# Patient Record
Sex: Female | Born: 1980 | Race: White | Hispanic: No | Marital: Married | Smoking: Never smoker
Health system: Southern US, Community
[De-identification: ages and names within clinical notes are randomized; demographics above are authoritative.]

## PROBLEM LIST (undated history)

## (undated) DIAGNOSIS — E559 Vitamin D deficiency, unspecified: Secondary | ICD-10-CM

## (undated) DIAGNOSIS — R7303 Prediabetes: Secondary | ICD-10-CM

## (undated) HISTORY — DX: Vitamin D deficiency, unspecified: E55.9

## (undated) HISTORY — DX: Prediabetes: R73.03

---

## 2001-12-18 ENCOUNTER — Other Ambulatory Visit: Admission: RE | Admit: 2001-12-18 | Discharge: 2001-12-18 | Payer: Self-pay | Admitting: Obstetrics and Gynecology

## 2003-02-07 ENCOUNTER — Other Ambulatory Visit: Admission: RE | Admit: 2003-02-07 | Discharge: 2003-02-07 | Payer: Self-pay | Admitting: Obstetrics and Gynecology

## 2004-02-13 ENCOUNTER — Other Ambulatory Visit: Admission: RE | Admit: 2004-02-13 | Discharge: 2004-02-13 | Payer: Self-pay | Admitting: Obstetrics and Gynecology

## 2005-03-25 ENCOUNTER — Other Ambulatory Visit: Admission: RE | Admit: 2005-03-25 | Discharge: 2005-03-25 | Payer: Self-pay | Admitting: Obstetrics and Gynecology

## 2011-09-23 ENCOUNTER — Ambulatory Visit: Payer: BC Managed Care – PPO

## 2011-09-23 ENCOUNTER — Ambulatory Visit (INDEPENDENT_AMBULATORY_CARE_PROVIDER_SITE_OTHER): Payer: BC Managed Care – PPO | Admitting: Internal Medicine

## 2011-09-23 VITALS — BP 120/84 | HR 80 | Temp 98.8°F | Resp 16 | Ht 68.5 in | Wt 109.0 lb

## 2011-09-23 DIAGNOSIS — R1310 Dysphagia, unspecified: Secondary | ICD-10-CM

## 2011-09-23 LAB — POCT CBC
Granulocyte percent: 45.2 %G (ref 37–80)
HCT, POC: 43.7 % (ref 37.7–47.9)
Hemoglobin: 14.1 g/dL (ref 12.2–16.2)
Lymph, poc: 2.1 (ref 0.6–3.4)
MCH, POC: 30.3 pg (ref 27–31.2)
MCHC: 32.3 g/dL (ref 31.8–35.4)
MCV: 93.7 fL (ref 80–97)
MID (cbc): 0.3 (ref 0–0.9)
MPV: 9.9 fL (ref 0–99.8)
POC Granulocyte: 1.9 — AB (ref 2–6.9)
POC LYMPH PERCENT: 48.2 %L (ref 10–50)
POC MID %: 6.6 %M (ref 0–12)
Platelet Count, POC: 200 10*3/uL (ref 142–424)
RBC: 4.66 M/uL (ref 4.04–5.48)
RDW, POC: 12.9 %
WBC: 4.3 10*3/uL — AB (ref 4.6–10.2)

## 2011-09-23 LAB — TSH: TSH: 1.17 u[IU]/mL (ref 0.350–4.500)

## 2011-09-23 NOTE — Patient Instructions (Signed)
Dysphagia  Swallowing problems (dysphagia) occur when solids and liquids seem to stick in your throat on the way down to your stomach, or the food takes longer to get to the stomach. Other symptoms (problems) include regurgitating (burping) up food, noises coming from the throat, chest discomfort with swallowing, and a feeling of fullness in the throat when swallowing. When blockage in the throat is complete it may be associated with drooling.  CAUSES  There are many causes of swallowing difficulties and the following is generalized information regarding a number of reasons for this problem. Problems with swallowing may occur because of problems with the muscles. The food cannot be propelled in the usual manner into the stomach. There may be ulcers, scar tissueor inflammation (soreness) in the esophagus (the food tube from the mouth to the stomach) which blocks food from passing normally into the stomach. Causes of inflammation include acid reflux from the stomach into the esophagus. Inflammation can also be caused by the herpes simplex virus, Candida (yeast), radiation (as with treatment of cancer), or inflammation from medications not taken with adequate fluids to wash them down into the stomach. There may be nerve problems so signals cannot be sent adequately telling the muscles of the esophagus to contract and move the food along. Achalasia is a rare disorder of the esophagus in which muscular contractions of the esophagus are uncoordinated. Globus hystericus is a relatively common problem in young females in which there is a sense of an obstruction or difficulty in swallowing, but in which no abnormalities can be found. This problem usually improves over time with reassurance and testing to rule out other causes.  DIAGNOSIS  A number of tests will help your caregiver know what is the cause of your swallowing problems. These tests may include a barium swallow in which x-rays are taken while you are drinking a  liquid that outlines the lining of the esophagus on x-ray. If the stomach and small bowel are also studied in this manner it is called an upper gastrointestinal exam (UGI). Endoscopy may be done in which your caregiver examines your throat, esophagus, stomach and small bowel with an instrument like a small flexible telescope. Motility studies which measure the effectiveness and coordination of the muscular contractions of the esophagus may also be done.  TREATMENT  The treatment of swallowing problems are many, varying from medications to surgical treatment. The treatment varies with the type of problem found. Your caregiver will discuss your results and treatment with you. If swallowing problems are severe the long term problems which may occur include: malnutrition, pneumonia (from food going into the breathing tubes called trachea and bronchi), and an increase in tumors (lumps) of the esophagus.  SEEK IMMEDIATE MEDICAL CARE IF:   Food or other object becomes lodged in your throat or esophagus and won't move.  Document Released: 03/25/2000 Document Revised: 03/17/2011 Document Reviewed: 11/14/2007  [employer] Patient Information 2012 [employer]

## 2011-09-23 NOTE — Progress Notes (Signed)
  Subjective:    Patient ID: Mercedes Lewis, female    DOB: [birth year], 31 y.o.   MRN: [id number]  HPI Patient presents with foreign body sensation in her esophagus. States the symptoms started yesterday, probably after eating breakfast. She denies and sore throat, pain, fever, chills, nausea, vomiting, trouble breathing, or lip/tongue swelling. Denies any history of thyroid problems or known medical problems. States she is able to swallow but it "feels like there is something in her throat." Says it is not painful but is intermittent in nature in that sometimes it feels "tighter" than others.      Review of Systems  All other systems reviewed and are negative.       Objective:   Physical Exam  Constitutional: She is oriented to person, place, and time. She appears well-developed and well-nourished.  HENT:  Head: Normocephalic and atraumatic.  Right Ear: External ear normal.  Left Ear: External ear normal.  Mouth/Throat: Oropharynx is clear and moist. No oropharyngeal exudate.  Eyes: Conjunctivae are normal.  Neck: Normal range of motion. Neck supple. No tracheal deviation present. No thyromegaly present.  Cardiovascular: Normal rate, regular rhythm and normal heart sounds.   Pulmonary/Chest: Effort normal and breath sounds normal.  Lymphadenopathy:    She has no cervical adenopathy.  Neurological: She is alert and oriented to person, place, and time.  Skin: Skin is warm and dry.  Psychiatric: She has a normal mood and affect. Her behavior is normal. Judgment and thought content normal.    [facility] reading (PRIMARY) by  Dr. Perrin Maltese as normal soft tissue neck.        Assessment & Plan:   1. Dysphagia  Likely an acute esophageal abrasion Treat symptoms with ibuprofen or tylenol Consider Maalox to help reduce acid  If no improvement in 24-48 hours, refer to ENT for further evaluation and treatment.  POCT CBC, TSH, DG Neck Soft Tissue

## 2012-05-28 ENCOUNTER — Ambulatory Visit (HOSPITAL_COMMUNITY)
Admission: RE | Admit: 2012-05-28 | Discharge: 2012-05-28 | Disposition: A | Discharge status: Home / Self Care | Payer: BC Managed Care – PPO | Source: Ambulatory Visit | Attending: Internal Medicine | Admitting: Internal Medicine

## 2012-05-28 ENCOUNTER — Other Ambulatory Visit (HOSPITAL_COMMUNITY): Payer: Self-pay | Admitting: Internal Medicine

## 2012-05-28 DIAGNOSIS — R229 Localized swelling, mass and lump, unspecified: Secondary | ICD-10-CM | POA: Insufficient documentation

## 2012-06-07 ENCOUNTER — Other Ambulatory Visit: Payer: Self-pay | Admitting: Otolaryngology

## 2012-06-13 ENCOUNTER — Ambulatory Visit
Admission: RE | Admit: 2012-06-13 | Discharge: 2012-06-13 | Disposition: A | Discharge status: Home / Self Care | Payer: BC Managed Care – PPO | Source: Ambulatory Visit | Attending: Otolaryngology | Admitting: Otolaryngology

## 2013-06-24 ENCOUNTER — Other Ambulatory Visit: Payer: Self-pay | Admitting: *Deleted

## 2013-06-24 ENCOUNTER — Other Ambulatory Visit: Payer: Self-pay | Admitting: Internal Medicine

## 2013-06-24 MED ORDER — NORETHIN ACE-ETH ESTRAD-FE 1-20 MG-MCG PO TABS: ORAL_TABLET | ORAL | Status: DC

## 2013-08-02 IMAGING — CT CT HEAD W/O CM
3 of 6 series · 16 of 47 positions shown, 19 images · non-contrast
Comparison: Plain film examination 05/28/2012.  No comparison CT or
MR.

CLINICAL DATA: Evaluate bony protrude there is left forehead.
Present for 10 years.  No trauma.  No pain.  The the patient feels
and may be growing.

CT HEAD WITHOUT CONTRAST
TECHNIQUE: Contiguous axial images were obtained from the base of
the skull through the vertex without contrast.

[Series 103: coronal · coronal · 0.44mm/px · 3 of 103 slices shown]
[im 21/103  brain]
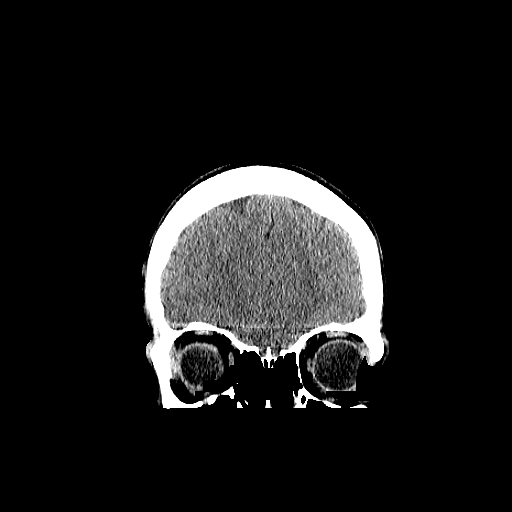
[im 41/103  brain]
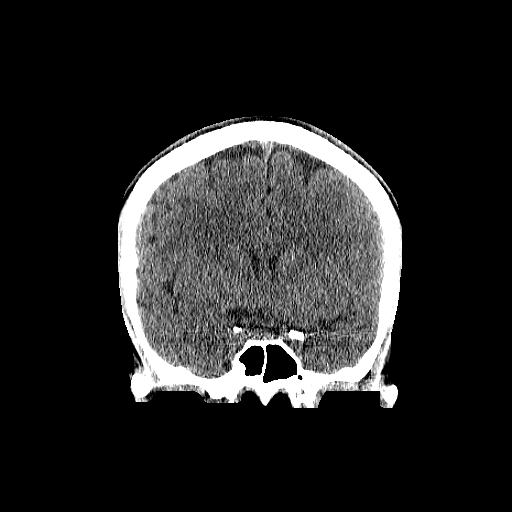
[im 62/103  brain]
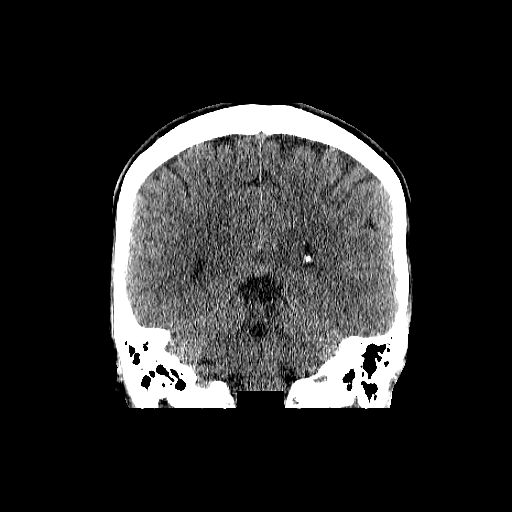

[Series 104: sagittal · sagittal · 0.44mm/px · 3 of 85 slices shown]
[im 29/85  brain]
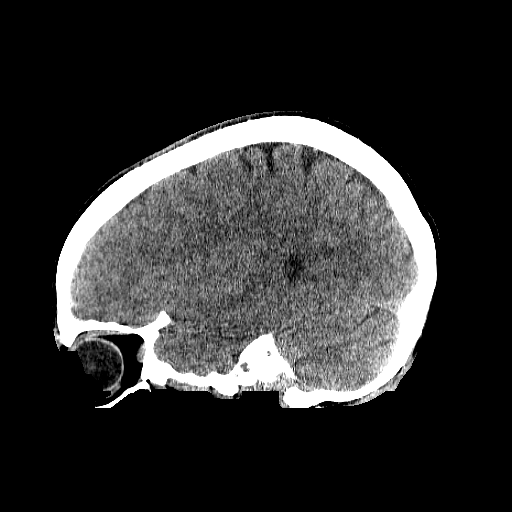
[im 43/85  brain]
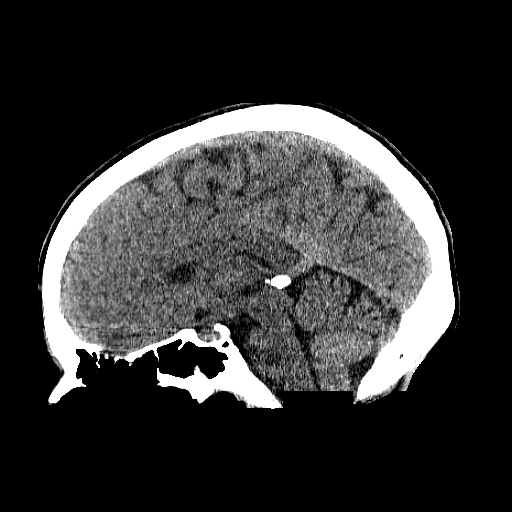
[im 57/85  brain]
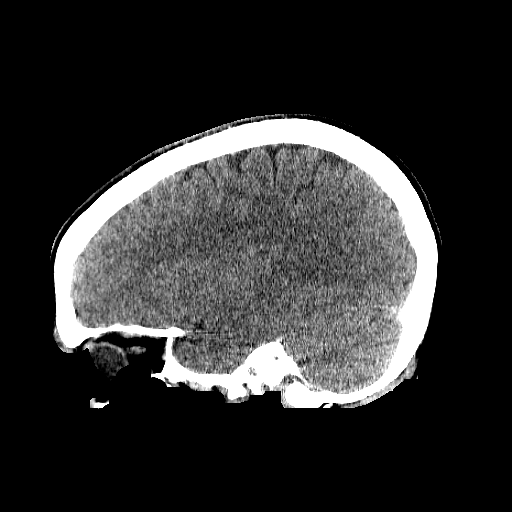

[Series 602: sagittal body · sagittal · 0.49mm/px · 10 of 62 slices shown, 13 images]
[im 5/62  brain]
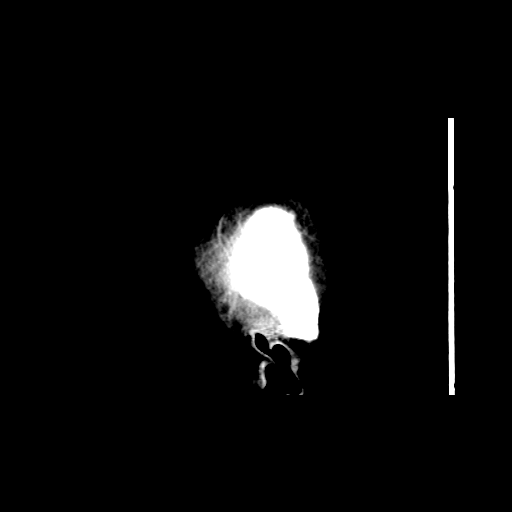
[im 5/62  bone]
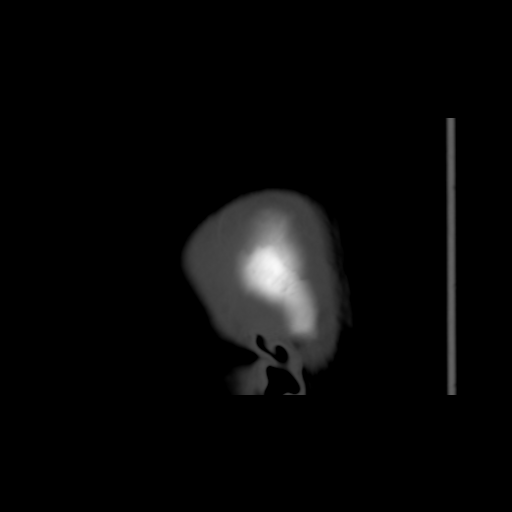
[im 13/62  brain]
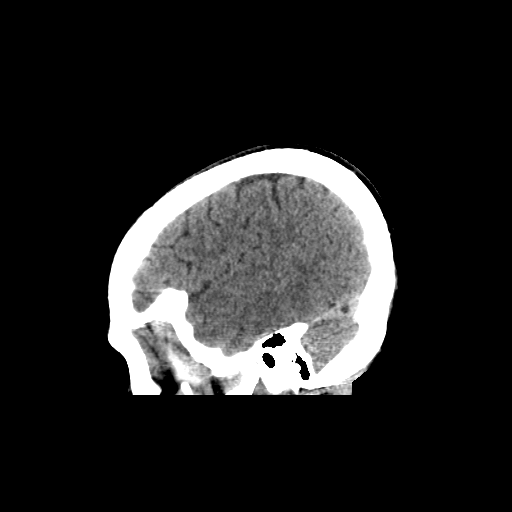
[im 17/62  brain]
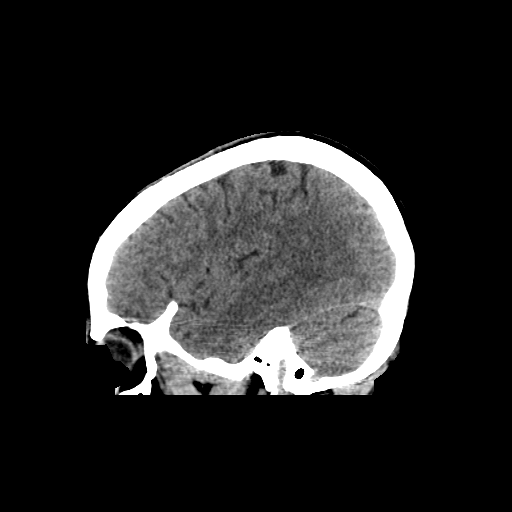
[im 21/62  brain]
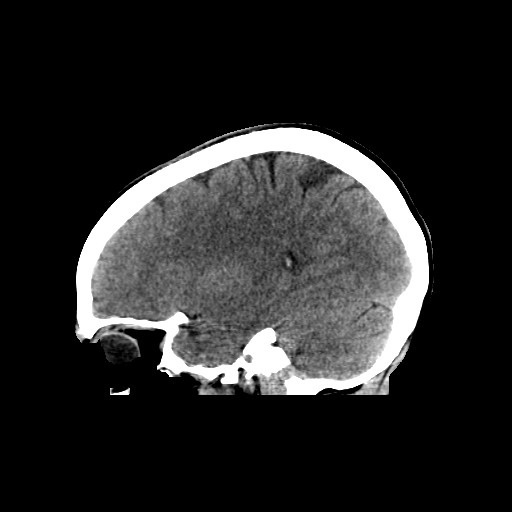
[im 29/62  brain]
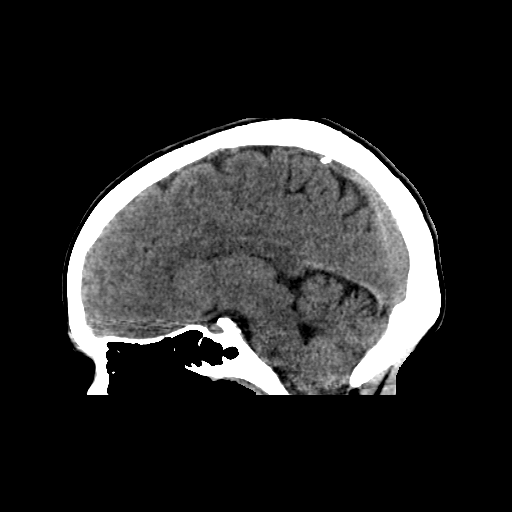
[im 29/62  bone]
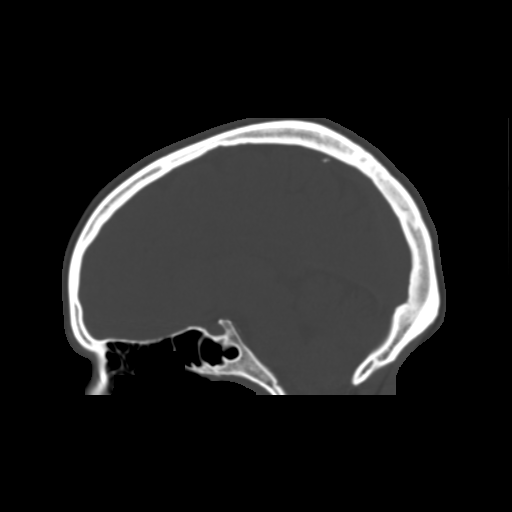
[im 33/62  brain]
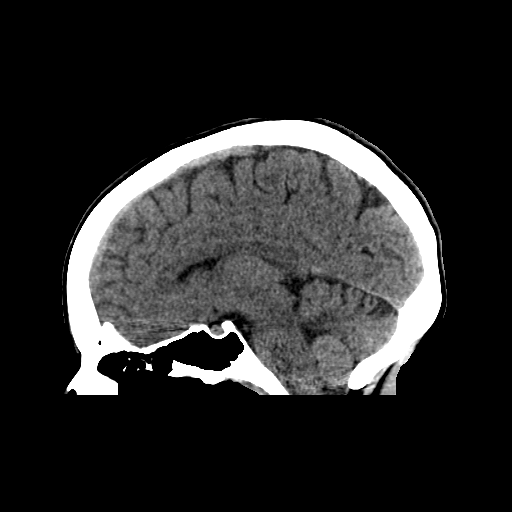
[im 41/62  brain]
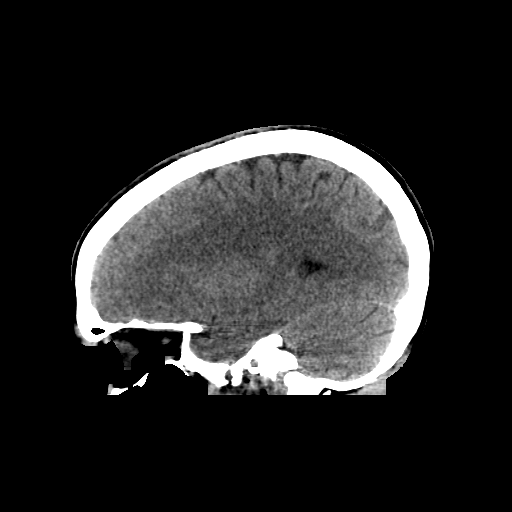
[im 45/62  brain]
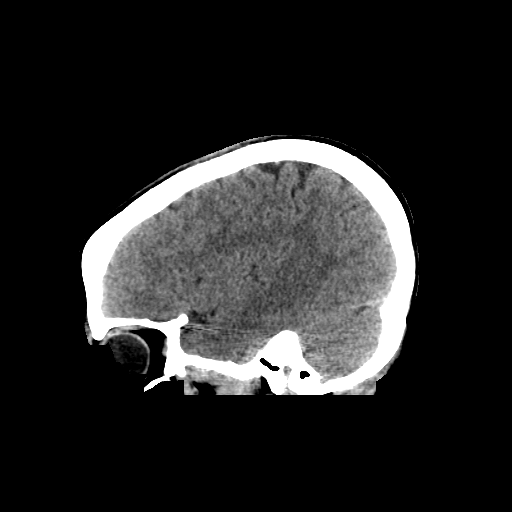
[im 49/62  brain]
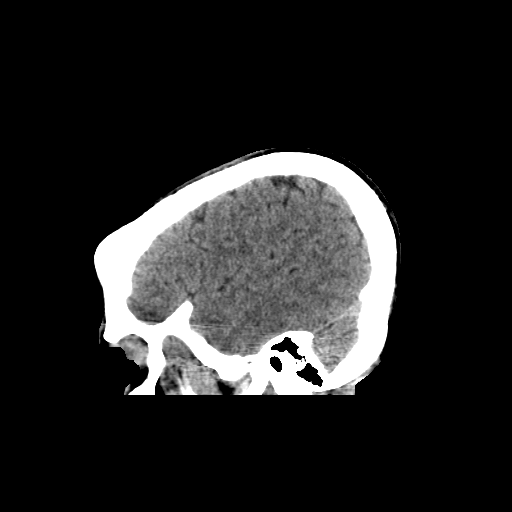
[im 49/62  bone]
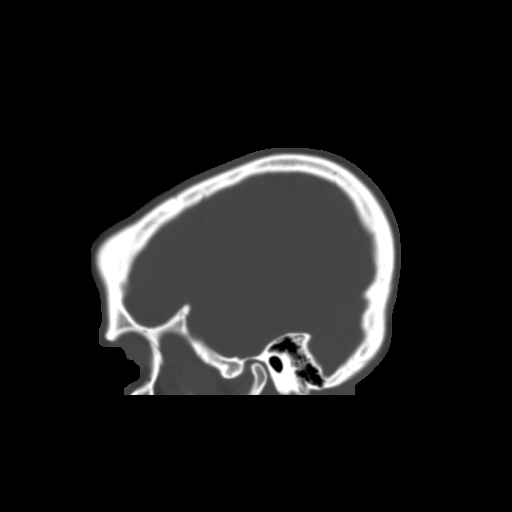
[im 57/62  brain]
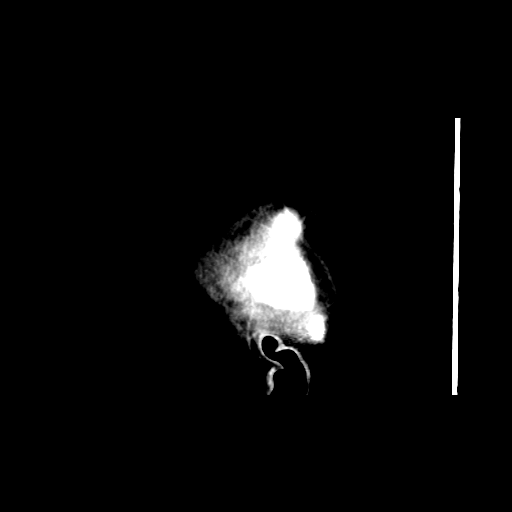

[16 of 47 positions shown; findings below may reference images not displayed]

FINDINGS: Anterior left frontal calvarium where the patient has a
palpable abnormality, there is bony overgrowth with an elliptical
shape which has an appearance most suggestive of an osteoma
(spanning over 2.9 cm).

Minimal partial opacification right mastoid air cells.

No intracranial hemorrhage.

No hydrocephalus.

No CT evidence of large acute infarct.

Slightly prominent pineal calcifications probably an incidental
finding.

Pituitary region, cervical medullary junction and orbital
structures unremarkable.
IMPRESSION: Anterior left frontal calvarium where the patient has a palpable
abnormality, there is bony overgrowth with an elliptical shape
which has an appearance most suggestive of an osteoma (spanning
over 2.9 cm).

Minimal partial opacification right mastoid air cells

## 2013-08-19 ENCOUNTER — Other Ambulatory Visit: Payer: Self-pay | Admitting: Emergency Medicine

## 2014-01-26 DIAGNOSIS — R7303 Prediabetes: Secondary | ICD-10-CM | POA: Insufficient documentation

## 2014-01-26 DIAGNOSIS — E559 Vitamin D deficiency, unspecified: Secondary | ICD-10-CM | POA: Insufficient documentation

## 2014-01-27 ENCOUNTER — Ambulatory Visit (INDEPENDENT_AMBULATORY_CARE_PROVIDER_SITE_OTHER): Payer: BC Managed Care – PPO | Admitting: Internal Medicine

## 2014-01-27 ENCOUNTER — Encounter: Payer: Self-pay | Admitting: Internal Medicine

## 2014-01-27 VITALS — BP 118/72 | HR 76 | Temp 98.1°F | Resp 16 | Ht 69.0 in | Wt 113.4 lb

## 2014-01-27 DIAGNOSIS — R7303 Prediabetes: Secondary | ICD-10-CM

## 2014-01-27 DIAGNOSIS — Z79899 Other long term (current) drug therapy: Secondary | ICD-10-CM

## 2014-01-27 DIAGNOSIS — Z1212 Encounter for screening for malignant neoplasm of rectum: Secondary | ICD-10-CM

## 2014-01-27 DIAGNOSIS — Z23 Encounter for immunization: Secondary | ICD-10-CM

## 2014-01-27 DIAGNOSIS — Z111 Encounter for screening for respiratory tuberculosis: Secondary | ICD-10-CM

## 2014-01-27 DIAGNOSIS — R6889 Other general symptoms and signs: Secondary | ICD-10-CM

## 2014-01-27 DIAGNOSIS — E559 Vitamin D deficiency, unspecified: Secondary | ICD-10-CM

## 2014-01-27 DIAGNOSIS — R7309 Other abnormal glucose: Secondary | ICD-10-CM

## 2014-01-27 DIAGNOSIS — Z0001 Encounter for general adult medical examination with abnormal findings: Secondary | ICD-10-CM

## 2014-01-27 LAB — CBC WITH DIFFERENTIAL/PLATELET
BASOS ABS: 0 10*3/uL (ref 0.0–0.1)
BASOS PCT: 0 % (ref 0–1)
EOS ABS: 0.1 10*3/uL (ref 0.0–0.7)
Eosinophils Relative: 2 % (ref 0–5)
HCT: 40.6 % (ref 36.0–46.0)
HEMOGLOBIN: 13.6 g/dL (ref 12.0–15.0)
Lymphocytes Relative: 58 % — ABNORMAL HIGH (ref 12–46)
Lymphs Abs: 3.1 10*3/uL (ref 0.7–4.0)
MCH: 30.6 pg (ref 26.0–34.0)
MCHC: 33.5 g/dL (ref 30.0–36.0)
MCV: 91.4 fL (ref 78.0–100.0)
Monocytes Absolute: 0.4 10*3/uL (ref 0.1–1.0)
Monocytes Relative: 8 % (ref 3–12)
NEUTROS PCT: 32 % — AB (ref 43–77)
Neutro Abs: 1.7 10*3/uL (ref 1.7–7.7)
Platelets: 168 10*3/uL (ref 150–400)
RBC: 4.44 MIL/uL (ref 3.87–5.11)
RDW: 12.8 % (ref 11.5–15.5)
WBC: 5.4 10*3/uL (ref 4.0–10.5)

## 2014-01-27 LAB — HEMOGLOBIN A1C
Hgb A1c MFr Bld: 5.5 % (ref <5.7)
Mean Plasma Glucose: 111 mg/dL (ref <117)

## 2014-01-27 NOTE — Patient Instructions (Signed)
Recommend the book "The END of DIETING" by Dr Baker Janus   and the book "The END of DIABETES " by Dr Excell Seltzer  At [email or web address] - get book & Audio CD's      Being diabetic has a  300% increased risk for heart attack, stroke, cancer, and alzheimer- type vascular dementia. It is very important that you work harder with diet by avoiding all foods that are white except chicken & fish. Avoid white rice (brown & wild rice is OK), white potatoes (sweetpotatoes in moderation is OK), White bread or wheat bread or anything made out of white flour like bagels, donuts, rolls, buns, biscuits, cakes, pastries, cookies, pizza crust, and pasta (made from white flour & egg whites) - vegetarian pasta or spinach or wheat pasta is OK. Multigrain breads like Arnold's or [employer], or multigrain sandwich thins or flatbreads.  Diet, exercise and weight loss can reverse and cure diabetes in the early stages.  Diet, exercise and weight loss is very important in the control and prevention of complications of diabetes which affects every system in your body, ie. Brain - dementia/stroke, eyes - glaucoma/blindness, heart - heart attack/heart failure, kidneys - dialysis, stomach - gastric paralysis, intestines - malabsorption, nerves - severe painful neuritis, circulation - gangrene & loss of a leg(s), and finally cancer and Alzheimers.    I recommend avoid fried & greasy foods,  sweets/candy, white rice (brown or wild rice or Quinoa is OK), white potatoes (sweet potatoes are OK) - anything made from white flour - bagels, doughnuts, rolls, buns, biscuits,white and wheat breads, pizza crust and traditional pasta made of white flour & egg white(vegetarian pasta or spinach or wheat pasta is OK).  Multi-grain bread is OK - like multi-grain flat bread or sandwich thins. Avoid alcohol in excess. Exercise is also important.    Eat all the vegetables you want - avoid meat, especially red meat and dairy - especially cheese.  Cheese  is the most concentrated form of trans-fats which is the worst thing to clog up our arteries. Veggie cheese is OK which can be found in the fresh produce section at [employer] or [employer] or [employer]  Preventive Care for Adults A healthy lifestyle and preventive care can promote health and wellness. Preventive health guidelines for women include the following key practices.  A routine yearly physical is a good way to check with your health care provider about your health and preventive screening. It is a chance to share any concerns and updates on your health and to receive a thorough exam.  Visit your dentist for a routine exam and preventive care every 6 months. Brush your teeth twice a day and floss once a day. Good oral hygiene prevents tooth decay and gum disease.  The frequency of eye exams is based on your age, health, family medical history, use of contact lenses, and other factors. Follow your health care provider's recommendations for frequency of eye exams.  Eat a healthy diet. Foods like vegetables, fruits, whole grains, low-fat dairy products, and lean protein foods contain the nutrients you need without too many calories. Decrease your intake of foods high in solid fats, added sugars, and salt. Eat the right amount of calories for you.Get information about a proper diet from your health care provider, if necessary.  Regular physical exercise is one of the most important things you can do for your health. Most adults should get at least 150 minutes of moderate-intensity exercise (any activity that increases  your heart rate and causes you to sweat) each week. In addition, most adults need muscle-strengthening exercises on 2 or more days a week.  Maintain a healthy weight. The body mass index (BMI) is a screening tool to identify possible weight problems. It provides an estimate of body fat based on height and weight. Your health care provider can find your BMI and can help you  achieve or maintain a healthy weight.For adults 20 years and older:  A BMI below 18.5 is considered underweight.  A BMI of 18.5 to 24.9 is normal.  A BMI of 25 to 29.9 is considered overweight.  A BMI of 30 and above is considered obese.  Maintain normal blood lipids and cholesterol levels by exercising and minimizing your intake of saturated fat. Eat a balanced diet with plenty of fruit and vegetables. Blood tests for lipids and cholesterol should begin at age 43 and be repeated every 5 years. If your lipid or cholesterol levels are high, you are over 50, or you are at high risk for heart disease, you may need your cholesterol levels checked more frequently.Ongoing high lipid and cholesterol levels should be treated with medicines if diet and exercise are not working.  If you smoke, find out from your health care provider how to quit. If you do not use tobacco, do not start.  Lung cancer screening is recommended for adults aged 40-80 years who are at high risk for developing lung cancer because of a history of smoking. A yearly low-dose CT scan of the lungs is recommended for people who have at least a 30-pack-year history of smoking and are a current smoker or have quit within the past 15 years. A pack year of smoking is smoking an average of 1 pack of cigarettes a day for 1 year (for example: 1 pack a day for 30 years or 2 packs a day for 15 years). Yearly screening should continue until the smoker has stopped smoking for at least 15 years. Yearly screening should be stopped for people who develop a health problem that would prevent them from having lung cancer treatment.  If you are pregnant, do not drink alcohol. If you are breastfeeding, be very cautious about drinking alcohol. If you are not pregnant and choose to drink alcohol, do not have more than 1 drink per day. One drink is considered to be 12 ounces (355 mL) of beer, 5 ounces (148 mL) of wine, or 1.5 ounces (44 mL) of liquor.  Avoid  use of street drugs. Do not share needles with anyone. Ask for help if you need support or instructions about stopping the use of drugs.  High blood pressure causes heart disease and increases the risk of stroke. Your blood pressure should be checked at least every 1 to 2 years. Ongoing high blood pressure should be treated with medicines if weight loss and exercise do not work.  If you are 74-43 years old, ask your health care provider if you should take aspirin to prevent strokes.  Diabetes screening involves taking a blood sample to check your fasting blood sugar level. This should be done once every 3 years, after age 105, if you are within normal weight and without risk factors for diabetes. Testing should be considered at a younger age or be carried out more frequently if you are overweight and have at least 1 risk factor for diabetes.  Breast cancer screening is essential preventive care for women. You should practice "breast self-awareness." This means understanding the  normal appearance and feel of your breasts and may include breast self-examination. Any changes detected, no matter how small, should be reported to a health care provider. Women in their 78s and 30s should have a clinical breast exam (CBE) by a health care provider as part of a regular health exam every 1 to 3 years. After age 70, women should have a CBE every year. Starting at age 79, women should consider having a mammogram (breast X-ray test) every year. Women who have a family history of breast cancer should talk to their health care provider about genetic screening. Women at a high risk of breast cancer should talk to their health care providers about having an MRI and a mammogram every year.  Breast cancer gene (BRCA)-related cancer risk assessment is recommended for women who have family members with BRCA-related cancers. BRCA-related cancers include breast, ovarian, tubal, and peritoneal cancers. Having family members with  these cancers may be associated with an increased risk for harmful changes (mutations) in the breast cancer genes BRCA1 and BRCA2. Results of the assessment will determine the need for genetic counseling and BRCA1 and BRCA2 testing.  Routine pelvic exams to screen for cancer are no longer recommended for nonpregnant women who are considered low risk for cancer of the pelvic organs (ovaries, uterus, and vagina) and who do not have symptoms. Ask your health care provider if a screening pelvic exam is right for you.  If you have had past treatment for cervical cancer or a condition that could lead to cancer, you need Pap tests and screening for cancer for at least 20 years after your treatment. If Pap tests have been discontinued, your risk factors (such as having a new sexual partner) need to be reassessed to determine if screening should be resumed. Some women have medical problems that increase the chance of getting cervical cancer. In these cases, your health care provider may recommend more frequent screening and Pap tests.  The HPV test is an additional test that may be used for cervical cancer screening. The HPV test looks for the virus that can cause the cell changes on the cervix. The cells collected during the Pap test can be tested for HPV. The HPV test could be used to screen women aged 40 years and older, and should be used in women of any age who have unclear Pap test results. After the age of 33, women should have HPV testing at the same frequency as a Pap test.  Colorectal cancer can be detected and often prevented. Most routine colorectal cancer screening begins at the age of 69 years and continues through age 77 years. However, your health care provider may recommend screening at an earlier age if you have risk factors for colon cancer. On a yearly basis, your health care provider may provide home test kits to check for hidden blood in the stool. Use of a small camera at the end of a tube, to  directly examine the colon (sigmoidoscopy or colonoscopy), can detect the earliest forms of colorectal cancer. Talk to your health care provider about this at age 57, when routine screening begins. Direct exam of the colon should be repeated every 5-10 years through age 37 years, unless early forms of pre-cancerous polyps or small growths are found.  People who are at an increased risk for hepatitis B should be screened for this virus. You are considered at high risk for hepatitis B if:  You were born in a country where hepatitis B occurs  often. Talk with your health care provider about which countries are considered high risk.  Your parents were born in a high-risk country and you have not received a shot to protect against hepatitis B (hepatitis B vaccine).  You have HIV or AIDS.  You use needles to inject street drugs.  You live with, or have sex with, someone who has hepatitis B.  You get hemodialysis treatment.  You take certain medicines for conditions like cancer, organ transplantation, and autoimmune conditions.  Hepatitis C blood testing is recommended for all people born from [birth year] through 1966 and any individual with known risks for hepatitis C.  Practice safe sex. Use condoms and avoid high-risk sexual practices to reduce the spread of sexually transmitted infections (STIs). STIs include gonorrhea, chlamydia, syphilis, trichomonas, herpes, HPV, and human immunodeficiency virus (HIV). Herpes, HIV, and HPV are viral illnesses that have no cure. They can result in disability, cancer, and death.  You should be screened for sexually transmitted illnesses (STIs) including gonorrhea and chlamydia if:  You are sexually active and are younger than 24 years.  You are older than 24 years and your health care provider tells you that you are at risk for this type of infection.  Your sexual activity has changed since you were last screened and you are at an increased risk for chlamydia or  gonorrhea. Ask your health care provider if you are at risk.  If you are at risk of being infected with HIV, it is recommended that you take a prescription medicine daily to prevent HIV infection. This is called preexposure prophylaxis (PrEP). You are considered at risk if:  You are a heterosexual woman, are sexually active, and are at increased risk for HIV infection.  You take drugs by injection.  You are sexually active with a partner who has HIV.  Talk with your health care provider about whether you are at high risk of being infected with HIV. If you choose to begin PrEP, you should first be tested for HIV. You should then be tested every 3 months for as long as you are taking PrEP.  Osteoporosis is a disease in which the bones lose minerals and strength with aging. This can result in serious bone fractures or breaks. The risk of osteoporosis can be identified using a bone density scan. Women ages 75 years and over and women at risk for fractures or osteoporosis should discuss screening with their health care providers. Ask your health care provider whether you should take a calcium supplement or vitamin D to reduce the rate of osteoporosis.  Menopause can be associated with physical symptoms and risks. Hormone replacement therapy is available to decrease symptoms and risks. You should talk to your health care provider about whether hormone replacement therapy is right for you.  Use sunscreen. Apply sunscreen liberally and repeatedly throughout the day. You should seek shade when your shadow is shorter than you. Protect yourself by wearing long sleeves, pants, a wide-brimmed hat, and sunglasses year round, whenever you are outdoors.  Once a month, do a whole body skin exam, using a mirror to look at the skin on your back. Tell your health care provider of new moles, moles that have irregular borders, moles that are larger than a pencil eraser, or moles that have changed in shape or  color.  Stay current with required vaccines (immunizations).  Influenza vaccine. All adults should be immunized every year.  Tetanus, diphtheria, and acellular pertussis (Td, Tdap) vaccine. Pregnant women should receive  1 dose of Tdap vaccine during each pregnancy. The dose should be obtained regardless of the length of time since the last dose. Immunization is preferred during the 27th-36th week of gestation. An adult who has not previously received Tdap or who does not know her vaccine status should receive 1 dose of Tdap. This initial dose should be followed by tetanus and diphtheria toxoids (Td) booster doses every 10 years. Adults with an unknown or incomplete history of completing a 3-dose immunization series with Td-containing vaccines should begin or complete a primary immunization series including a Tdap dose. Adults should receive a Td booster every 10 years.  Varicella vaccine. An adult without evidence of immunity to varicella should receive 2 doses or a second dose if she has previously received 1 dose. Pregnant females who do not have evidence of immunity should receive the first dose after pregnancy. This first dose should be obtained before leaving the health care facility. The second dose should be obtained 4-8 weeks after the first dose.  Human papillomavirus (HPV) vaccine. Females aged 13-26 years who have not received the vaccine previously should obtain the 3-dose series. The vaccine is not recommended for use in pregnant females. However, pregnancy testing is not needed before receiving a dose. If a female is found to be pregnant after receiving a dose, no treatment is needed. In that case, the remaining doses should be delayed until after the pregnancy. Immunization is recommended for any person with an immunocompromised condition through the age of 32 years if she did not get any or all doses earlier. During the 3-dose series, the second dose should be obtained 4-8 weeks after the  first dose. The third dose should be obtained 24 weeks after the first dose and 16 weeks after the second dose.  Zoster vaccine. One dose is recommended for adults aged 26 years or older unless certain conditions are present.  Measles, mumps, and rubella (MMR) vaccine. Adults born before [birth year] generally are considered immune to measles and mumps. Adults born in [birth year] or later should have 1 or more doses of MMR vaccine unless there is a contraindication to the vaccine or there is laboratory evidence of immunity to each of the three diseases. A routine second dose of MMR vaccine should be obtained at least 28 days after the first dose for students attending postsecondary schools, health care workers, or international travelers. People who received inactivated measles vaccine or an unknown type of measles vaccine during 1963-1967 should receive 2 doses of MMR vaccine. People who received inactivated mumps vaccine or an unknown type of mumps vaccine before 1979 and are at high risk for mumps infection should consider immunization with 2 doses of MMR vaccine. For females of childbearing age, rubella immunity should be determined. If there is no evidence of immunity, females who are not pregnant should be vaccinated. If there is no evidence of immunity, females who are pregnant should delay immunization until after pregnancy. Unvaccinated health care workers born before [birth year] who lack laboratory evidence of measles, mumps, or rubella immunity or laboratory confirmation of disease should consider measles and mumps immunization with 2 doses of MMR vaccine or rubella immunization with 1 dose of MMR vaccine.  Pneumococcal 13-valent conjugate (PCV13) vaccine. When indicated, a person who is uncertain of her immunization history and has no record of immunization should receive the PCV13 vaccine. An adult aged 47 years or older who has certain medical conditions and has not been previously immunized should receive 1 dose of  PCV13 vaccine. This PCV13 should be followed with a dose of pneumococcal polysaccharide (PPSV23) vaccine. The PPSV23 vaccine dose should be obtained at least 8 weeks after the dose of PCV13 vaccine. An adult aged 66 years or older who has certain medical conditions and previously received 1 or more doses of PPSV23 vaccine should receive 1 dose of PCV13. The PCV13 vaccine dose should be obtained 1 or more years after the last PPSV23 vaccine dose.  Pneumococcal polysaccharide (PPSV23) vaccine. When PCV13 is also indicated, PCV13 should be obtained first. All adults aged 41 years and older should be immunized. An adult younger than age 20 years who has certain medical conditions should be immunized. Any person who resides in a nursing home or long-term care facility should be immunized. An adult smoker should be immunized. People with an immunocompromised condition and certain other conditions should receive both PCV13 and PPSV23 vaccines. People with human immunodeficiency virus (HIV) infection should be immunized as soon as possible after diagnosis. Immunization during chemotherapy or radiation therapy should be avoided. Routine use of PPSV23 vaccine is not recommended for American Indians, [place] Natives, or people younger than 65 years unless there are medical conditions that require PPSV23 vaccine. When indicated, people who have unknown immunization and have no record of immunization should receive PPSV23 vaccine. One-time revaccination 5 years after the first dose of PPSV23 is recommended for people aged 19-64 years who have chronic kidney failure, nephrotic syndrome, asplenia, or immunocompromised conditions. People who received 1-2 doses of PPSV23 before age 33 years should receive another dose of PPSV23 vaccine at age 19 years or later if at least 5 years have passed since the previous dose. Doses of PPSV23 are not needed for people immunized with PPSV23 at or after age 15 years.  Meningococcal vaccine.  Adults with asplenia or persistent complement component deficiencies should receive 2 doses of quadrivalent meningococcal conjugate (MenACWY-D) vaccine. The doses should be obtained at least 2 months apart. Microbiologists working with certain meningococcal bacteria, [employer] recruits, people at risk during an outbreak, and people who travel to or live in countries with a high rate of meningitis should be immunized. A first-year college student up through age 52 years who is living in a residence hall should receive a dose if she did not receive a dose on or after her 16th birthday. Adults who have certain high-risk conditions should receive one or more doses of vaccine.  Hepatitis A vaccine. Adults who wish to be protected from this disease, have certain high-risk conditions, work with hepatitis A-infected animals, work in hepatitis A research labs, or travel to or work in countries with a high rate of hepatitis A should be immunized. Adults who were previously unvaccinated and who anticipate close contact with an international adoptee during the first 60 days after arrival in the [place] from a country with a high rate of hepatitis A should be immunized.  Hepatitis B vaccine. Adults who wish to be protected from this disease, have certain high-risk conditions, may be exposed to blood or other infectious body fluids, are household contacts or sex partners of hepatitis B positive people, are clients or workers in certain care facilities, or travel to or work in countries with a high rate of hepatitis B should be immunized.  Haemophilus influenzae type b (Hib) vaccine. A previously unvaccinated person with asplenia or sickle cell disease or having a scheduled splenectomy should receive 1 dose of Hib vaccine. Regardless of previous immunization, a recipient of a hematopoietic stem  cell transplant should receive a 3-dose series 6-12 months after her successful transplant. Hib vaccine is not recommended for  adults with HIV infection. Preventive Services / Frequency  Ages 19 to 64 years  Blood pressure check.** / Every 1 to 2 years.  Lipid and cholesterol check.** / Every 5 years beginning at age 28.  Clinical breast exam.** / Every 3 years for women in their 88s and 51s.  BRCA-related cancer risk assessment.** / For women who have family members with a BRCA-related cancer (breast, ovarian, tubal, or peritoneal cancers).  Pap test.** / Every 2 years from ages 64 through 35. Every 3 years starting at age 45 through age 64 or 61 with a history of 3 consecutive normal Pap tests.  HPV screening.** / Every 3 years from ages 5 through ages 60 to 14 with a history of 3 consecutive normal Pap tests.  Hepatitis C blood test.** / For any individual with known risks for hepatitis C.  Skin self-exam. / Monthly.  Influenza vaccine. / Every year.  Tetanus, diphtheria, and acellular pertussis (Tdap, Td) vaccine.** / Consult your health care provider. Pregnant women should receive 1 dose of Tdap vaccine during each pregnancy. 1 dose of Td every 10 years.  Varicella vaccine.** / Consult your health care provider. Pregnant females who do not have evidence of immunity should receive the first dose after pregnancy.  HPV vaccine. / 3 doses over 6 months, if 35 and younger. The vaccine is not recommended for use in pregnant females. However, pregnancy testing is not needed before receiving a dose.  Measles, mumps, rubella (MMR) vaccine.** / You need at least 1 dose of MMR if you were born in 1957 or later. You may also need a 2nd dose. For females of childbearing age, rubella immunity should be determined. If there is no evidence of immunity, females who are not pregnant should be vaccinated. If there is no evidence of immunity, females who are pregnant should delay immunization until after pregnancy.  Pneumococcal 13-valent conjugate (PCV13) vaccine.** / Consult your health care provider.  Pneumococcal  polysaccharide (PPSV23) vaccine.** / 1 to 2 doses if you smoke cigarettes or if you have certain conditions.  Meningococcal vaccine.** / 1 dose if you are age 98 to 27 years and a Market researcher living in a residence hall, or have one of several medical conditions, you need to get vaccinated against meningococcal disease. You may also need additional booster doses.  Hepatitis A vaccine.** / Consult your health care provider.  Hepatitis B vaccine.** / Consult your health care provider.  Haemophilus influenzae type b (Hib) vaccine.** / Consult your health care provider.

## 2014-01-27 NOTE — Progress Notes (Signed)
Patient ID: Mercedes Lewis, female   DOB: 1980/05/16, 33 y.o.   MRN: [id number]  Annual Screening Comprehensive Examination   This very nice 33 y.o.MWF presents for complete physical.  Patient has no major health issues.    Finally, patient has history of Vitamin D Deficiency and last vitamin D was 7668 in Oct 2014. Patient also has a history of mild glucose intolerance with A1c 5.7% in Sept 2013 and A1c 5.5% in oct 2014. Patient also has hx/o elevated LDL 146 in 2007 and with improved diet last LDL was 65 in Oct 2014.  Prenatal MVit  Vit D 1000 u/da  No Known Allergies  Past Medical History  Diagnosis Date  . Prediabetes   . Vitamin D deficiency    Immunization History  Administered Date(s) Administered  . Influenza-Unspecified 01/07/2014  . PPD Test 01/27/2014  . Pneumococcal Conjugate-13 01/27/2014  . Tdap 01/22/2013   No past surgical history on file.  Fhx (+) for HTN, PreDiabetes, HLD.  History   Social History  . Marital Status: Married 1  &1/2 yrs    Spouse Name: N/A    Number of Children: N/A  . Years of Education: N/A   Occupational History  . Clerical - Manufacturing systems engineerbenefits manager   Social History Main Topics  . Smoking status: Never Smoker   . Smokeless tobacco: Never Used  . Alcohol Use: 0.0 oz/week     Comment: 2-3 times a week  . Drug Use: No  . Sexual Activity: Active   ROS Constitutional: Denies fever, chills, weight loss/gain, headaches, insomnia, fatigue, night sweats, and change in appetite. Eyes: Denies redness, blurred vision, diplopia, discharge, itchy, watery eyes.  ENT: Denies discharge, congestion, post nasal drip, epistaxis, sore throat, earache, hearing loss, dental pain, Tinnitus, Vertigo, Sinus pain, snoring.  Cardio: Denies chest pain, palpitations, irregular heartbeat, syncope, dyspnea, diaphoresis, orthopnea, PND, claudication, edema Respiratory: denies cough, dyspnea, DOE, pleurisy, hoarseness, laryngitis, wheezing.  Gastrointestinal:  Denies dysphagia, heartburn, reflux, water brash, pain, cramps, nausea, vomiting, bloating, diarrhea, constipation, hematemesis, melena, hematochezia, jaundice, hemorrhoids Genitourinary: Denies dysuria, frequency, urgency, nocturia, hesitancy, discharge, hematuria, flank pain Breast: Breast lumps, nipple discharge, bleeding.  Musculoskeletal: Denies arthralgia, myalgia, stiffness, Jt. Swelling, pain, limp, and strain/sprain. Skin: Denies puritis, rash, hives, warts, acne, eczema, changing in skin lesion Neuro: Weakness, tremor, incoordination, spasms, paresthesia, pain Psychiatric: Denies confusion, memory loss, sensory loss. Endocrine: Denies change in weight, skin, hair change, nocturia, and paresthesia, diabetic polys, visual blurring, hyper /hypo glycemic episodes.  Heme/Lymph: No excessive bleeding, bruising, enlarged lymph nodes.  Physical Exam  BP 118/72  Pulse 76  Temp 98.1 F   Resp 16  Ht 5\' 9"    Wt 113 lb 6.4 oz   BMI 16.74   General Appearance: Well nourished and in no apparent distress. Eyes: PERRLA, EOMs, conjunctiva no swelling or erythema, normal fundi and vessels. Sinuses: No frontal/maxillary tenderness ENT/Mouth: EACs patent / TMs  nl. Nares clear without erythema, swelling, mucoid exudates. Oral hygiene is good. No erythema, swelling, or exudate. Tongue normal, non-obstructing. Tonsils not swollen or erythematous. Hearing normal.  Neck: Supple, thyroid normal. No bruits, nodes or JVD. Respiratory: Respiratory effort normal.  BS equal and clear bilateral without rales, rhonci, wheezing or stridor. Cardio: Heart sounds are normal with regular rate and rhythm and no murmurs, rubs or gallops. Peripheral pulses are normal and equal bilaterally without edema. No aortic or femoral bruits. Chest: symmetric with normal excursions and percussion. Breasts: Symmetric, without lumps, nipple discharge, retractions, or fibrocystic changes.  Abdomen: Flat, soft, with bowl sounds.  Nontender, no guarding, rebound, hernias, masses, or organomegaly.  Lymphatics: Non tender without lymphadenopathy.  Genitourinary:  Musculoskeletal: Full ROM all peripheral extremities, joint stability, 5/5 strength, and normal gait. Skin: Warm and dry without rashes, lesions, cyanosis, clubbing or  ecchymosis.  Neuro: Cranial nerves intact, reflexes equal bilaterally. Normal muscle tone, no cerebellar symptoms. Sensation intact.  Pysch: Awake and oriented X 3, normal affect, Insight and Judgment appropriate.   Assessment and Plan 1. Annual Screening Examination 2. Vitamin D Deficiency 3. Hx/o Prediabetes 4. Hx/o Hyperlipidemia  Continue prudent diet as discussed, weight control, regular exercise, and medications. Routine screening labs and tests as requested with regular follow-up as recommended. Prevnar 13 in anticipation of pregnancy

## 2014-01-28 LAB — LIPID PANEL
CHOL/HDL RATIO: 2.5 ratio
Cholesterol: 199 mg/dL (ref 0–200)
HDL: 80 mg/dL (ref >39)
LDL CALC: 109 mg/dL — AB (ref 0–99)
Triglycerides: 52 mg/dL (ref <150)
VLDL: 10 mg/dL (ref 0–40)

## 2014-01-28 LAB — HEPATIC FUNCTION PANEL
ALBUMIN: 5 g/dL (ref 3.5–5.2)
ALT: 19 U/L (ref 0–35)
AST: 21 U/L (ref 0–37)
Alkaline Phosphatase: 57 U/L (ref 39–117)
BILIRUBIN TOTAL: 0.7 mg/dL (ref 0.2–1.2)
Bilirubin, Direct: 0.1 mg/dL (ref 0.0–0.3)
Indirect Bilirubin: 0.6 mg/dL (ref 0.2–1.2)
Total Protein: 7.8 g/dL (ref 6.0–8.3)

## 2014-01-28 LAB — BASIC METABOLIC PANEL WITH GFR
BUN: 18 mg/dL (ref 6–23)
CO2: 22 mEq/L (ref 19–32)
Calcium: 9.5 mg/dL (ref 8.4–10.5)
Chloride: 100 mEq/L (ref 96–112)
Creat: 0.73 mg/dL (ref 0.50–1.10)
GFR, Est African American: >89 mL/min
Glucose, Bld: 83 mg/dL (ref 70–99)
Potassium: 3.9 mEq/L (ref 3.5–5.3)
SODIUM: 136 meq/L (ref 135–145)

## 2014-01-28 LAB — IRON AND TIBC
%SAT: 33 % (ref 20–55)
Iron: 124 ug/dL (ref 42–145)
TIBC: 376 ug/dL (ref 250–470)
UIBC: 252 ug/dL (ref 125–400)

## 2014-01-28 LAB — MICROALBUMIN / CREATININE URINE RATIO
Creatinine, Urine: 27.1 mg/dL
Microalb Creat Ratio: 11.1 mg/g (ref 0.0–30.0)
Microalb, Ur: 0.3 mg/dL (ref <2.0)

## 2014-01-28 LAB — URINALYSIS, MICROSCOPIC ONLY
BACTERIA UA: NONE SEEN
CASTS: NONE SEEN
Crystals: NONE SEEN
SQUAMOUS EPITHELIAL / LPF: NONE SEEN

## 2014-01-28 LAB — INSULIN, FASTING: INSULIN FASTING, SERUM: 3.2 u[IU]/mL (ref 2.0–19.6)

## 2014-01-28 LAB — VITAMIN B12: VITAMIN B 12: 1152 pg/mL — AB (ref 211–911)

## 2014-01-28 LAB — TSH: TSH: 0.913 u[IU]/mL (ref 0.350–4.500)

## 2014-01-28 LAB — VITAMIN D 25 HYDROXY (VIT D DEFICIENCY, FRACTURES): Vit D, 25-Hydroxy: 71 ng/mL (ref 30–89)

## 2014-01-28 LAB — MAGNESIUM: Magnesium: 2 mg/dL (ref 1.5–2.5)

## 2014-03-21 ENCOUNTER — Ambulatory Visit: Payer: Self-pay

## 2014-04-24 LAB — US OB FOLLOW UP

## 2014-07-18 ENCOUNTER — Encounter (HOSPITAL_COMMUNITY): Payer: Self-pay | Admitting: Obstetrics and Gynecology

## 2014-07-18 ENCOUNTER — Other Ambulatory Visit (HOSPITAL_COMMUNITY): Payer: Self-pay | Admitting: Obstetrics and Gynecology

## 2014-07-18 DIAGNOSIS — R9389 Abnormal findings on diagnostic imaging of other specified body structures: Secondary | ICD-10-CM

## 2014-07-18 DIAGNOSIS — Z3A26 26 weeks gestation of pregnancy: Secondary | ICD-10-CM

## 2014-07-18 DIAGNOSIS — O09512 Supervision of elderly primigravida, second trimester: Secondary | ICD-10-CM

## 2014-08-07 ENCOUNTER — Ambulatory Visit (HOSPITAL_COMMUNITY): Admission: RE | Admit: 2014-08-07 | Payer: Self-pay | Source: Ambulatory Visit

## 2014-08-07 ENCOUNTER — Ambulatory Visit (HOSPITAL_COMMUNITY): Payer: Self-pay

## 2014-09-10 ENCOUNTER — Encounter (HOSPITAL_COMMUNITY): Payer: Self-pay

## 2014-09-10 ENCOUNTER — Ambulatory Visit (HOSPITAL_COMMUNITY)
Admission: RE | Admit: 2014-09-10 | Discharge: 2014-09-10 | Disposition: A | Discharge status: Home / Self Care | Payer: BLUE CROSS/BLUE SHIELD | Source: Ambulatory Visit | Attending: Obstetrics and Gynecology | Admitting: Obstetrics and Gynecology

## 2014-09-10 ENCOUNTER — Other Ambulatory Visit (HOSPITAL_COMMUNITY): Payer: Self-pay | Admitting: Obstetrics and Gynecology

## 2014-09-10 ENCOUNTER — Other Ambulatory Visit (HOSPITAL_COMMUNITY): Payer: Self-pay | Admitting: Maternal and Fetal Medicine

## 2014-09-10 DIAGNOSIS — Z3A31 31 weeks gestation of pregnancy: Secondary | ICD-10-CM | POA: Diagnosis not present

## 2014-09-10 DIAGNOSIS — Z36 Encounter for antenatal screening of mother: Secondary | ICD-10-CM | POA: Diagnosis not present

## 2014-09-10 DIAGNOSIS — O09513 Supervision of elderly primigravida, third trimester: Secondary | ICD-10-CM

## 2014-09-10 DIAGNOSIS — Z3689 Encounter for other specified antenatal screening: Secondary | ICD-10-CM | POA: Insufficient documentation

## 2014-09-10 DIAGNOSIS — O358XX0 Maternal care for other (suspected) fetal abnormality and damage, not applicable or unspecified: Secondary | ICD-10-CM

## 2014-09-10 DIAGNOSIS — O35EXX0 Maternal care for other (suspected) fetal abnormality and damage, fetal genitourinary anomalies, not applicable or unspecified: Secondary | ICD-10-CM

## 2014-09-10 DIAGNOSIS — O09519 Supervision of elderly primigravida, unspecified trimester: Secondary | ICD-10-CM | POA: Insufficient documentation

## 2014-09-11 ENCOUNTER — Other Ambulatory Visit (HOSPITAL_COMMUNITY): Payer: Self-pay | Admitting: Obstetrics and Gynecology

## 2014-10-01 ENCOUNTER — Ambulatory Visit (HOSPITAL_COMMUNITY)
Admission: RE | Admit: 2014-10-01 | Discharge: 2014-10-01 | Disposition: A | Discharge status: Home / Self Care | Payer: BLUE CROSS/BLUE SHIELD | Source: Ambulatory Visit | Attending: Obstetrics and Gynecology | Admitting: Obstetrics and Gynecology

## 2014-10-01 DIAGNOSIS — O35EXX0 Maternal care for other (suspected) fetal abnormality and damage, fetal genitourinary anomalies, not applicable or unspecified: Secondary | ICD-10-CM

## 2014-10-01 DIAGNOSIS — O09513 Supervision of elderly primigravida, third trimester: Secondary | ICD-10-CM | POA: Diagnosis not present

## 2014-10-01 DIAGNOSIS — Z3A34 34 weeks gestation of pregnancy: Secondary | ICD-10-CM | POA: Diagnosis not present

## 2014-10-01 DIAGNOSIS — O358XX0 Maternal care for other (suspected) fetal abnormality and damage, not applicable or unspecified: Secondary | ICD-10-CM | POA: Diagnosis present

## 2014-10-01 DIAGNOSIS — O09519 Supervision of elderly primigravida, unspecified trimester: Secondary | ICD-10-CM | POA: Insufficient documentation

## 2015-02-05 ENCOUNTER — Encounter: Payer: Self-pay | Admitting: Internal Medicine

## 2015-02-05 ENCOUNTER — Ambulatory Visit (INDEPENDENT_AMBULATORY_CARE_PROVIDER_SITE_OTHER): Payer: 59 | Admitting: Internal Medicine

## 2015-02-05 VITALS — BP 102/64 | HR 76 | Temp 97.7°F | Resp 16 | Ht 69.5 in | Wt 112.8 lb

## 2015-02-05 DIAGNOSIS — E559 Vitamin D deficiency, unspecified: Secondary | ICD-10-CM

## 2015-02-05 DIAGNOSIS — I10 Essential (primary) hypertension: Secondary | ICD-10-CM

## 2015-02-05 DIAGNOSIS — R7303 Prediabetes: Secondary | ICD-10-CM

## 2015-02-05 DIAGNOSIS — Z23 Encounter for immunization: Secondary | ICD-10-CM

## 2015-02-05 DIAGNOSIS — Z0000 Encounter for general adult medical examination without abnormal findings: Secondary | ICD-10-CM | POA: Diagnosis not present

## 2015-02-05 DIAGNOSIS — Z111 Encounter for screening for respiratory tuberculosis: Secondary | ICD-10-CM

## 2015-02-05 DIAGNOSIS — Z79899 Other long term (current) drug therapy: Secondary | ICD-10-CM | POA: Insufficient documentation

## 2015-02-05 DIAGNOSIS — E785 Hyperlipidemia, unspecified: Secondary | ICD-10-CM | POA: Insufficient documentation

## 2015-02-05 DIAGNOSIS — Z681 Body mass index (BMI) 19 or less, adult: Secondary | ICD-10-CM

## 2015-02-05 DIAGNOSIS — R5383 Other fatigue: Secondary | ICD-10-CM

## 2015-02-05 DIAGNOSIS — Z0001 Encounter for general adult medical examination with abnormal findings: Secondary | ICD-10-CM

## 2015-02-05 LAB — CBC WITH DIFFERENTIAL/PLATELET
Basophils Absolute: 0 10*3/uL (ref 0.0–0.1)
Basophils Relative: 0 % (ref 0–1)
EOS PCT: 1 % (ref 0–5)
Eosinophils Absolute: 0.1 10*3/uL (ref 0.0–0.7)
HEMATOCRIT: 38.4 % (ref 36.0–46.0)
Hemoglobin: 12.6 g/dL (ref 12.0–15.0)
LYMPHS ABS: 2.1 10*3/uL (ref 0.7–4.0)
LYMPHS PCT: 41 % (ref 12–46)
MCH: 30.1 pg (ref 26.0–34.0)
MCHC: 32.8 g/dL (ref 30.0–36.0)
MCV: 91.6 fL (ref 78.0–100.0)
MONO ABS: 0.5 10*3/uL (ref 0.1–1.0)
MPV: 10.8 fL (ref 8.6–12.4)
Monocytes Relative: 9 % (ref 3–12)
Neutro Abs: 2.5 10*3/uL (ref 1.7–7.7)
Neutrophils Relative %: 49 % (ref 43–77)
Platelets: 182 10*3/uL (ref 150–400)
RBC: 4.19 MIL/uL (ref 3.87–5.11)
RDW: 12.9 % (ref 11.5–15.5)
WBC: 5.1 10*3/uL (ref 4.0–10.5)

## 2015-02-05 LAB — HEPATIC FUNCTION PANEL
ALBUMIN: 4.5 g/dL (ref 3.6–5.1)
ALT: 24 U/L (ref 6–29)
AST: 22 U/L (ref 10–30)
Alkaline Phosphatase: 77 U/L (ref 33–115)
BILIRUBIN DIRECT: 0.1 mg/dL (ref <=0.2)
Indirect Bilirubin: 0.5 mg/dL (ref 0.2–1.2)
Total Bilirubin: 0.6 mg/dL (ref 0.2–1.2)
Total Protein: 7.1 g/dL (ref 6.1–8.1)

## 2015-02-05 LAB — BASIC METABOLIC PANEL WITH GFR
BUN: 16 mg/dL (ref 7–25)
CO2: 27 mmol/L (ref 20–31)
Calcium: 9.3 mg/dL (ref 8.6–10.2)
Chloride: 105 mmol/L (ref 98–110)
Creat: 0.81 mg/dL (ref 0.50–1.10)
GFR, Est African American: >89 mL/min (ref >=60)
GLUCOSE: 64 mg/dL — AB (ref 65–99)
POTASSIUM: 4 mmol/L (ref 3.5–5.3)
SODIUM: 142 mmol/L (ref 135–146)

## 2015-02-05 LAB — LIPID PANEL
CHOL/HDL RATIO: 2.4 ratio (ref <=5.0)
CHOLESTEROL: 193 mg/dL (ref 125–200)
HDL: 81 mg/dL (ref >=46)
LDL Cholesterol: 105 mg/dL (ref <130)
Triglycerides: 36 mg/dL (ref <150)
VLDL: 7 mg/dL (ref <30)

## 2015-02-05 LAB — IRON AND TIBC
%SAT: 33 % (ref 11–50)
Iron: 137 ug/dL (ref 40–190)
TIBC: 416 ug/dL (ref 250–450)
UIBC: 279 ug/dL (ref 125–400)

## 2015-02-05 LAB — HEMOGLOBIN A1C
HEMOGLOBIN A1C: 5.5 % (ref <5.7)
MEAN PLASMA GLUCOSE: 111 mg/dL (ref <117)

## 2015-02-05 LAB — MAGNESIUM: Magnesium: 2.1 mg/dL (ref 1.5–2.5)

## 2015-02-05 LAB — TSH: TSH: 1.159 u[IU]/mL (ref 0.350–4.500)

## 2015-02-05 LAB — VITAMIN B12: Vitamin B-12: 1182 pg/mL — ABNORMAL HIGH (ref 211–911)

## 2015-02-05 NOTE — Patient Instructions (Signed)
Recommend Adult Low Dose Aspirin or   coated  Aspirin 81 mg daily   To reduce risk of Colon Cancer 20 %,   Skin Cancer 26 % ,   Melanoma 46%   and   Pancreatic cancer 60%   ++++++++++++++++++++++++++++++++++++++++++++++++++++++  Vitamin D goal   is between 70-100.   Please make sure that you are taking your Vitamin D as directed.   It is very important as a natural anti-inflammatory   helping hair, skin, and nails, as well as reducing stroke and heart attack risk.   It helps your bones and helps with mood.  It also decreases numerous cancer risks so please take it as directed.   Low Vit D is associated with a 200-300% higher risk for CANCER   and 200-300% higher risk for HEART   ATTACK  &  STROKE.   ......................................  It is also associated with higher death rate at younger ages,   autoimmune diseases like Rheumatoid arthritis, Lupus, Multiple Sclerosis.     Also many other serious conditions, like depression, Alzheimer's  Dementia, infertility, muscle aches, fatigue, fibromyalgia - just to name a few.  ++++++++++++++++++++++++++++++++++++++++++++++++  Recommend the book "The END of DIETING" by Dr Joel Fuhrman   & the book "The END of DIABETES " by Dr Joel Fuhrman  At [employer] - get book & Audio CD's     Being diabetic has a  300% increased risk for heart attack, stroke, cancer, and alzheimer- type vascular dementia. It is very important that you work harder with diet by avoiding all foods that are white. Avoid white rice (brown & wild rice is OK), white potatoes (sweetpotatoes in moderation is OK), White bread or wheat bread or anything made out of white flour like bagels, donuts, rolls, buns, biscuits, cakes, pastries, cookies, pizza crust, and pasta (made from white flour & egg whites) - vegetarian pasta or spinach or wheat pasta is OK. Multigrain breads like Arnold's or [employer], or multigrain sandwich thins or flatbreads.  Diet,  exercise and weight loss can reverse and cure diabetes in the early stages.  Diet, exercise and weight loss is very important in the control and prevention of complications of diabetes which affects every system in your body, ie. Brain - dementia/stroke, eyes - glaucoma/blindness, heart - heart attack/heart failure, kidneys - dialysis, stomach - gastric paralysis, intestines - malabsorption, nerves - severe painful neuritis, circulation - gangrene & loss of a leg(s), and finally cancer and Alzheimers.    I recommend avoid fried & greasy foods,  sweets/candy, white rice (brown or wild rice or Quinoa is OK), white potatoes (sweet potatoes are OK) - anything made from white flour - bagels, doughnuts, rolls, buns, biscuits,white and wheat breads, pizza crust and traditional pasta made of white flour & egg white(vegetarian pasta or spinach or wheat pasta is OK).  Multi-grain bread is OK - like multi-grain flat bread or sandwich thins. Avoid alcohol in excess. Exercise is also important.    Eat all the vegetables you want - avoid meat, especially red meat and dairy - especially cheese.  Cheese is the most concentrated form of trans-fats which is the worst thing to clog up our arteries. Veggie cheese is OK which can be found in the fresh produce section at [employer] or [employer] or [employer]  ++++++++++++++++++++++++++++++++++++++++++++++++++ DASH Eating Plan  DASH stands for "Dietary Approaches to Stop Hypertension."   The DASH eating plan is a healthy eating plan that has been shown to reduce high   blood pressure (hypertension). Additional health benefits may include reducing the risk of type 2 diabetes mellitus, heart disease, and stroke. The DASH eating plan may also help with weight loss.  WHAT DO I NEED TO KNOW ABOUT THE DASH EATING PLAN? For the DASH eating plan, you will follow these general guidelines:  Choose foods with a percent daily value for sodium of less than 5% (as listed on the food  label).  Use salt-free seasonings or herbs instead of table salt or sea salt.  Check with your health care provider or pharmacist before using salt substitutes.  Eat lower-sodium products, often labeled as "lower sodium" or "no salt added."  Eat fresh foods.  Eat more vegetables, fruits, and low-fat dairy products.    Choose whole grains. Look for the word "whole" as the first word in the ingredient list.  Choose fish   Limit sweets, desserts, sugars, and sugary drinks.  Choose heart-healthy fats.  Eat veggie cheese   Eat more home-cooked food and less restaurant, buffet, and fast food.  Limit fried foods.  Cook foods using methods other than frying.  Limit canned vegetables. If you do use them, rinse them well to decrease the sodium.  When eating at a restaurant, ask that your food be prepared with less salt, or no salt if possible.                      WHAT FOODS CAN I EAT?  Seek help from a dietitian for individual calorie needs. Grains Whole grain or whole wheat bread. Brown rice. Whole grain or whole wheat pasta. Quinoa, bulgur, and whole grain cereals. Low-sodium cereals. Corn or whole wheat flour tortillas. Whole grain cornbread. Whole grain crackers. Low-sodium crackers.  Vegetables Fresh or frozen vegetables (raw, steamed, roasted, or grilled). Low-sodium or reduced-sodium tomato and vegetable juices. Low-sodium or reduced-sodium tomato sauce and paste. Low-sodium or reduced-sodium canned vegetables.   Fruits All fresh, canned (in natural juice), or frozen fruits.  Meat and Other Protein Products  All fish and seafood.  Dried beans, peas, or lentils. Unsalted nuts and seeds. Unsalted canned beans. Dairy Low-fat dairy products, such as skim or 1% milk, 2% or reduced-fat cheeses, low-fat ricotta or cottage cheese, or plain low-fat yogurt. Low-sodium or reduced-sodium cheeses.  Fats and Oils Tub margarines without trans fats. Light or reduced-fat mayonnaise  and salad dressings (reduced sodium). Avocado. Safflower, olive, or canola oils. Natural peanut or almond butter.  Other Unsalted popcorn and pretzels. The items listed above may not be a complete list of recommended foods or beverages. Contact your dietitian for more options.  +++++++++++++++++++++++++++++++++++++++++++  WHAT FOODS ARE NOT RECOMMENDED?  Grains/ White flour or wheat flour  White bread. White pasta. White rice. Refined cornbread. Bagels and croissants. Crackers that contain trans fat.  Vegetables  Creamed or fried vegetables. Vegetables in a . Regular canned vegetables. Regular canned tomato sauce and paste. Regular tomato and vegetable juices.  Fruits Dried fruits. Canned fruit in light or heavy syrup. Fruit juice.  Meat and Other Protein Products Meat in general. Fatty cuts of meat. Ribs, chicken wings, bacon, sausage, bologna, salami, chitterlings, fatback, hot dogs, bratwurst, and packaged luncheon meats. Salted nuts and seeds. Canned beans with salt.  Dairy Whole or 2% milk, cream, half-and-half, and cream cheese. Whole-fat or sweetened yogurt. Full-fat cheeses or blue cheese. Nondairy creamers and whipped toppings. Processed cheese, cheese spreads, or cheese curds.  Condiments Onion and garlic salt, seasoned salt, table salt, and sea  salt. Canned and packaged gravies. Worcestershire sauce. Tartar sauce. Barbecue sauce. Teriyaki sauce. Soy sauce, including reduced sodium. Steak sauce. Fish sauce. Oyster sauce. Cocktail sauce. Horseradish. Ketchup and mustard. Meat flavorings and tenderizers. Bouillon cubes. Hot sauce. Tabasco sauce. Marinades. Taco seasonings. Relishes.  Fats and Oils Butter, stick margarine, lard, shortening, ghee, and bacon fat. Coconut, palm kernel, or palm oils. Regular salad dressings.  Pickles and olives. Salted popcorn and pretzels. The items listed above may not be a complete list of foods and beverages to avoid.   Preventive Care for  Adults  A healthy lifestyle and preventive care can promote health and wellness. Preventive health guidelines for women include the following key practices.  A routine yearly physical is a good way to check with your health care provider about your health and preventive screening. It is a chance to share any concerns and updates on your health and to receive a thorough exam.  Visit your dentist for a routine exam and preventive care every 6 months. Brush your teeth twice a day and floss once a day. Good oral hygiene prevents tooth decay and gum disease.  The frequency of eye exams is based on your age, health, family medical history, use of contact lenses, and other factors. Follow your health care provider's recommendations for frequency of eye exams.  Eat a healthy diet. Foods like vegetables, fruits, whole grains, low-fat dairy products, and lean protein foods contain the nutrients you need without too many calories. Decrease your intake of foods high in solid fats, added sugars, and salt. Eat the right amount of calories for you.Get information about a proper diet from your health care provider, if necessary.  Regular physical exercise is one of the most important things you can do for your health. Most adults should get at least 150 minutes of moderate-intensity exercise (any activity that increases your heart rate and causes you to sweat) each week. In addition, most adults need muscle-strengthening exercises on 2 or more days a week.  Maintain a healthy weight. The body mass index (BMI) is a screening tool to identify possible weight problems. It provides an estimate of body fat based on height and weight. Your health care provider can find your BMI and can help you achieve or maintain a healthy weight.For adults 20 years and older:  A BMI below 18.5 is considered underweight.  A BMI of 18.5 to 24.9 is normal.  A BMI of 25 to 29.9 is considered overweight.  A BMI of 30 and above is  considered obese.  Maintain normal blood lipids and cholesterol levels by exercising and minimizing your intake of saturated fat. Eat a balanced diet with plenty of fruit and vegetables. Blood tests for lipids and cholesterol should begin at age 90 and be repeated every 5 years. If your lipid or cholesterol levels are high, you are over 50, or you are at high risk for heart disease, you may need your cholesterol levels checked more frequently.Ongoing high lipid and cholesterol levels should be treated with medicines if diet and exercise are not working.  If you smoke, find out from your health care provider how to quit. If you do not use tobacco, do not start.  Lung cancer screening is recommended for adults aged 24-80 years who are at high risk for developing lung cancer because of a history of smoking. A yearly low-dose CT scan of the lungs is recommended for people who have at least a 30-pack-year history of smoking and are a current  smoker or have quit within the past 15 years. A pack year of smoking is smoking an average of 1 pack of cigarettes a day for 1 year (for example: 1 pack a day for 30 years or 2 packs a day for 15 years). Yearly screening should continue until the smoker has stopped smoking for at least 15 years. Yearly screening should be stopped for people who develop a health problem that would prevent them from having lung cancer treatment.  If you are pregnant, do not drink alcohol. If you are breastfeeding, be very cautious about drinking alcohol. If you are not pregnant and choose to drink alcohol, do not have more than 1 drink per day. One drink is considered to be 12 ounces (355 mL) of beer, 5 ounces (148 mL) of wine, or 1.5 ounces (44 mL) of liquor.  Avoid use of street drugs. Do not share needles with anyone. Ask for help if you need support or instructions about stopping the use of drugs.  High blood pressure causes heart disease and increases the risk of stroke. Your blood  pressure should be checked at least every 1 to 2 years. Ongoing high blood pressure should be treated with medicines if weight loss and exercise do not work.  If you are 81-95 years old, ask your health care provider if you should take aspirin to prevent strokes.  Diabetes screening involves taking a blood sample to check your fasting blood sugar level. This should be done once every 3 years, after age 11, if you are within normal weight and without risk factors for diabetes. Testing should be considered at a younger age or be carried out more frequently if you are overweight and have at least 1 risk factor for diabetes.  Breast cancer screening is essential preventive care for women. You should practice "breast self-awareness." This means understanding the normal appearance and feel of your breasts and may include breast self-examination. Any changes detected, no matter how small, should be reported to a health care provider. Women in their 19s and 30s should have a clinical breast exam (CBE) by a health care provider as part of a regular health exam every 1 to 3 years. After age 71, women should have a CBE every year. Starting at age 38, women should consider having a mammogram (breast X-ray test) every year. Women who have a family history of breast cancer should talk to their health care provider about genetic screening. Women at a high risk of breast cancer should talk to their health care providers about having an MRI and a mammogram every year.  Breast cancer gene (BRCA)-related cancer risk assessment is recommended for women who have family members with BRCA-related cancers. BRCA-related cancers include breast, ovarian, tubal, and peritoneal cancers. Having family members with these cancers may be associated with an increased risk for harmful changes (mutations) in the breast cancer genes BRCA1 and BRCA2. Results of the assessment will determine the need for genetic counseling and BRCA1 and BRCA2  testing.  Routine pelvic exams to screen for cancer are no longer recommended for nonpregnant women who are considered low risk for cancer of the pelvic organs (ovaries, uterus, and vagina) and who do not have symptoms. Ask your health care provider if a screening pelvic exam is right for you.  If you have had past treatment for cervical cancer or a condition that could lead to cancer, you need Pap tests and screening for cancer for at least 20 years after your treatment. If Pap  tests have been discontinued, your risk factors (such as having a new sexual partner) need to be reassessed to determine if screening should be resumed. Some women have medical problems that increase the chance of getting cervical cancer. In these cases, your health care provider may recommend more frequent screening and Pap tests.  The HPV test is an additional test that may be used for cervical cancer screening. The HPV test looks for the virus that can cause the cell changes on the cervix. The cells collected during the Pap test can be tested for HPV. The HPV test could be used to screen women aged 29 years and older, and should be used in women of any age who have unclear Pap test results. After the age of 67, women should have HPV testing at the same frequency as a Pap test.  Colorectal cancer can be detected and often prevented. Most routine colorectal cancer screening begins at the age of 54 years and continues through age 101 years. However, your health care provider may recommend screening at an earlier age if you have risk factors for colon cancer. On a yearly basis, your health care provider may provide home test kits to check for hidden blood in the stool. Use of a small camera at the end of a tube, to directly examine the colon (sigmoidoscopy or colonoscopy), can detect the earliest forms of colorectal cancer. Talk to your health care provider about this at age 14, when routine screening begins. Direct exam of the colon  should be repeated every 5-10 years through age 84 years, unless early forms of pre-cancerous polyps or small growths are found.  People who are at an increased risk for hepatitis B should be screened for this virus. You are considered at high risk for hepatitis B if:  You were born in a country where hepatitis B occurs often. Talk with your health care provider about which countries are considered high risk.  Your parents were born in a high-risk country and you have not received a shot to protect against hepatitis B (hepatitis B vaccine).  You have HIV or AIDS.  You use needles to inject street drugs.  You live with, or have sex with, someone who has hepatitis B.  You get hemodialysis treatment.  You take certain medicines for conditions like cancer, organ transplantation, and autoimmune conditions.  Hepatitis C blood testing is recommended for all people born from [birth year] through 1966 and any individual with known risks for hepatitis C.  Practice safe sex. Use condoms and avoid high-risk sexual practices to reduce the spread of sexually transmitted infections (STIs). STIs include gonorrhea, chlamydia, syphilis, trichomonas, herpes, HPV, and human immunodeficiency virus (HIV). Herpes, HIV, and HPV are viral illnesses that have no cure. They can result in disability, cancer, and death.  You should be screened for sexually transmitted illnesses (STIs) including gonorrhea and chlamydia if:  You are sexually active and are younger than 24 years.  You are older than 24 years and your health care provider tells you that you are at risk for this type of infection.  Your sexual activity has changed since you were last screened and you are at an increased risk for chlamydia or gonorrhea. Ask your health care provider if you are at risk.  If you are at risk of being infected with HIV, it is recommended that you take a prescription medicine daily to prevent HIV infection. This is called preexposure  prophylaxis (PrEP). You are considered at risk if:  You are a heterosexual woman, are sexually active, and are at increased risk for HIV infection.  You take drugs by injection.  You are sexually active with a partner who has HIV.  Talk with your health care provider about whether you are at high risk of being infected with HIV. If you choose to begin PrEP, you should first be tested for HIV. You should then be tested every 3 months for as long as you are taking PrEP.  Osteoporosis is a disease in which the bones lose minerals and strength with aging. This can result in serious bone fractures or breaks. The risk of osteoporosis can be identified using a bone density scan. Women ages 57 years and over and women at risk for fractures or osteoporosis should discuss screening with their health care providers. Ask your health care provider whether you should take a calcium supplement or vitamin D to reduce the rate of osteoporosis.  Menopause can be associated with physical symptoms and risks. Hormone replacement therapy is available to decrease symptoms and risks. You should talk to your health care provider about whether hormone replacement therapy is right for you.  Use sunscreen. Apply sunscreen liberally and repeatedly throughout the day. You should seek shade when your shadow is shorter than you. Protect yourself by wearing long sleeves, pants, a wide-brimmed hat, and sunglasses year round, whenever you are outdoors.  Once a month, do a whole body skin exam, using a mirror to look at the skin on your back. Tell your health care provider of new moles, moles that have irregular borders, moles that are larger than a pencil eraser, or moles that have changed in shape or color.  Stay current with required vaccines (immunizations).  Influenza vaccine. All adults should be immunized every year.  Tetanus, diphtheria, and acellular pertussis (Td, Tdap) vaccine. Pregnant women should receive 1 dose of  Tdap vaccine during each pregnancy. The dose should be obtained regardless of the length of time since the last dose. Immunization is preferred during the 27th-36th week of gestation. An adult who has not previously received Tdap or who does not know her vaccine status should receive 1 dose of Tdap. This initial dose should be followed by tetanus and diphtheria toxoids (Td) booster doses every 10 years. Adults with an unknown or incomplete history of completing a 3-dose immunization series with Td-containing vaccines should begin or complete a primary immunization series including a Tdap dose. Adults should receive a Td booster every 10 years.  Varicella vaccine. An adult without evidence of immunity to varicella should receive 2 doses or a second dose if she has previously received 1 dose. Pregnant females who do not have evidence of immunity should receive the first dose after pregnancy. This first dose should be obtained before leaving the health care facility. The second dose should be obtained 4-8 weeks after the first dose.  Human papillomavirus (HPV) vaccine. Females aged 13-26 years who have not received the vaccine previously should obtain the 3-dose series. The vaccine is not recommended for use in pregnant females. However, pregnancy testing is not needed before receiving a dose. If a female is found to be pregnant after receiving a dose, no treatment is needed. In that case, the remaining doses should be delayed until after the pregnancy. Immunization is recommended for any person with an immunocompromised condition through the age of 67 years if she did not get any or all doses earlier. During the 3-dose series, the second dose should be obtained 4-8  weeks after the first dose. The third dose should be obtained 24 weeks after the first dose and 16 weeks after the second dose.  Zoster vaccine. One dose is recommended for adults aged 39 years or older unless certain conditions are  present.  Measles, mumps, and rubella (MMR) vaccine. Adults born before 60 generally are considered immune to measles and mumps. Adults born in [birth year] or later should have 1 or more doses of MMR vaccine unless there is a contraindication to the vaccine or there is laboratory evidence of immunity to each of the three diseases. A routine second dose of MMR vaccine should be obtained at least 28 days after the first dose for students attending postsecondary schools, health care workers, or international travelers. People who received inactivated measles vaccine or an unknown type of measles vaccine during 1963-1967 should receive 2 doses of MMR vaccine. People who received inactivated mumps vaccine or an unknown type of mumps vaccine before 1979 and are at high risk for mumps infection should consider immunization with 2 doses of MMR vaccine. For females of childbearing age, rubella immunity should be determined. If there is no evidence of immunity, females who are not pregnant should be vaccinated. If there is no evidence of immunity, females who are pregnant should delay immunization until after pregnancy. Unvaccinated health care workers born before [birth year] who lack laboratory evidence of measles, mumps, or rubella immunity or laboratory confirmation of disease should consider measles and mumps immunization with 2 doses of MMR vaccine or rubella immunization with 1 dose of MMR vaccine.  Pneumococcal 13-valent conjugate (PCV13) vaccine. When indicated, a person who is uncertain of her immunization history and has no record of immunization should receive the PCV13 vaccine. An adult aged 73 years or older who has certain medical conditions and has not been previously immunized should receive 1 dose of PCV13 vaccine. This PCV13 should be followed with a dose of pneumococcal polysaccharide (PPSV23) vaccine. The PPSV23 vaccine dose should be obtained at least 8 weeks after the dose of PCV13 vaccine. An adult aged 59  years or older who has certain medical conditions and previously received 1 or more doses of PPSV23 vaccine should receive 1 dose of PCV13. The PCV13 vaccine dose should be obtained 1 or more years after the last PPSV23 vaccine dose.  Pneumococcal polysaccharide (PPSV23) vaccine. When PCV13 is also indicated, PCV13 should be obtained first. All adults aged 63 years and older should be immunized. An adult younger than age 35 years who has certain medical conditions should be immunized. Any person who resides in a nursing home or long-term care facility should be immunized. An adult smoker should be immunized. People with an immunocompromised condition and certain other conditions should receive both PCV13 and PPSV23 vaccines. People with human immunodeficiency virus (HIV) infection should be immunized as soon as possible after diagnosis. Immunization during chemotherapy or radiation therapy should be avoided. Routine use of PPSV23 vaccine is not recommended for American Indians, [place] Natives, or people younger than 65 years unless there are medical conditions that require PPSV23 vaccine. When indicated, people who have unknown immunization and have no record of immunization should receive PPSV23 vaccine. One-time revaccination 5 years after the first dose of PPSV23 is recommended for people aged 19-64 years who have chronic kidney failure, nephrotic syndrome, asplenia, or immunocompromised conditions. People who received 1-2 doses of PPSV23 before age 44 years should receive another dose of PPSV23 vaccine at age 16 years or later if at least 5 years  have passed since the previous dose. Doses of PPSV23 are not needed for people immunized with PPSV23 at or after age 72 years.  Meningococcal vaccine. Adults with asplenia or persistent complement component deficiencies should receive 2 doses of quadrivalent meningococcal conjugate (MenACWY-D) vaccine. The doses should be obtained at least 2 months apart.  Microbiologists working with certain meningococcal bacteria, [employer] recruits, people at risk during an outbreak, and people who travel to or live in countries with a high rate of meningitis should be immunized. A first-year college student up through age 38 years who is living in a residence hall should receive a dose if she did not receive a dose on or after her 16th birthday. Adults who have certain high-risk conditions should receive one or more doses of vaccine.  Hepatitis A vaccine. Adults who wish to be protected from this disease, have certain high-risk conditions, work with hepatitis A-infected animals, work in hepatitis A research labs, or travel to or work in countries with a high rate of hepatitis A should be immunized. Adults who were previously unvaccinated and who anticipate close contact with an international adoptee during the first 60 days after arrival in the [place] from a country with a high rate of hepatitis A should be immunized.  Hepatitis B vaccine. Adults who wish to be protected from this disease, have certain high-risk conditions, may be exposed to blood or other infectious body fluids, are household contacts or sex partners of hepatitis B positive people, are clients or workers in certain care facilities, or travel to or work in countries with a high rate of hepatitis B should be immunized.  Haemophilus influenzae type b (Hib) vaccine. A previously unvaccinated person with asplenia or sickle cell disease or having a scheduled splenectomy should receive 1 dose of Hib vaccine. Regardless of previous immunization, a recipient of a hematopoietic stem cell transplant should receive a 3-dose series 6-12 months after her successful transplant. Hib vaccine is not recommended for adults with HIV infection. Preventive Services / Frequency  Ages 29 to 60 years  Blood pressure check.  Lipid and cholesterol check.  Clinical breast exam.** / Every 3 years for women in their 37s  and 20s.  BRCA-related cancer risk assessment.** / For women who have family members with a BRCA-related cancer (breast, ovarian, tubal, or peritoneal cancers).  Pap test.** / Every 2 years from ages 32 through 42. Every 3 years starting at age 13 through age 83 or 55 with a history of 3 consecutive normal Pap tests.  HPV screening.** / Every 3 years from ages 36 through ages 50 to 19 with a history of 3 consecutive normal Pap tests.  Hepatitis C blood test.** / For any individual with known risks for hepatitis C.  Skin self-exam. / Monthly.  Influenza vaccine. / Every year.  Tetanus, diphtheria, and acellular pertussis (Tdap, Td) vaccine.** / Consult your health care provider. Pregnant women should receive 1 dose of Tdap vaccine during each pregnancy. 1 dose of Td every 10 years.  Varicella vaccine.** / Consult your health care provider. Pregnant females who do not have evidence of immunity should receive the first dose after pregnancy.  HPV vaccine. / 3 doses over 6 months, if 30 and younger. The vaccine is not recommended for use in pregnant females. However, pregnancy testing is not needed before receiving a dose.  Measles, mumps, rubella (MMR) vaccine.** / You need at least 1 dose of MMR if you were born in 1957 or later. You may also  need a 2nd dose. For females of childbearing age, rubella immunity should be determined. If there is no evidence of immunity, females who are not pregnant should be vaccinated. If there is no evidence of immunity, females who are pregnant should delay immunization until after pregnancy.  Pneumococcal 13-valent conjugate (PCV13) vaccine.** / Consult your health care provider.  Pneumococcal polysaccharide (PPSV23) vaccine.** / 1 to 2 doses if you smoke cigarettes or if you have certain conditions.  Meningococcal vaccine.** / 1 dose if you are age 72 to 69 years and a Market researcher living in a residence hall, or have one of several medical  conditions, you need to get vaccinated against meningococcal disease. You may also need additional booster doses.  Hepatitis A vaccine.** / Consult your health care provider.  Hepatitis B vaccine.** / Consult your health care provider.  Haemophilus influenzae type b (Hib) vaccine.** / Consult your health care provider.

## 2015-02-06 LAB — URINALYSIS, ROUTINE W REFLEX MICROSCOPIC
BILIRUBIN URINE: NEGATIVE
Glucose, UA: NEGATIVE
Hgb urine dipstick: NEGATIVE
Ketones, ur: NEGATIVE
Leukocytes, UA: NEGATIVE
Nitrite: NEGATIVE
Protein, ur: NEGATIVE
SPECIFIC GRAVITY, URINE: 1.012 (ref 1.001–1.035)
pH: 6.5 (ref 5.0–8.0)

## 2015-02-06 LAB — VITAMIN D 25 HYDROXY (VIT D DEFICIENCY, FRACTURES): VIT D 25 HYDROXY: 35 ng/mL (ref 30–100)

## 2015-02-06 LAB — INSULIN, RANDOM: INSULIN: 4.9 u[IU]/mL (ref 2.0–19.6)

## 2015-02-08 ENCOUNTER — Encounter: Payer: Self-pay | Admitting: Internal Medicine

## 2015-02-08 DIAGNOSIS — Z681 Body mass index (BMI) 19 or less, adult: Secondary | ICD-10-CM | POA: Insufficient documentation

## 2015-02-08 NOTE — Progress Notes (Addendum)
Patient ID: Mercedes Lewis, female   DOB: 1980/11/20, 34 y.o.   MRN: [id number]   Annual Preventative/Wellness Visit And Comprehensive Examination & Management      This very nice 34 y.o. MWF presents for presents for a Wellness/Preventative Visit & comprehensive evaluation and management of multiple medical co-morbidities.  Patient has been followed for Prediabetes, Hyperlipidemia and Vitamin D Deficiency.      Patient's BP has been controlled and today's BP is  102/64 mmHg.  Patient denies any cardiac symptoms as chest pain, palpitations, shortness of breath, dizziness or ankle swelling.       Patient's hyperlipidemia is controlled with diet and note she had been on medication until 2014 when she married and anticipated pregnancy and in fact she is now 3 months Post Partum (& nursing).  Patient alleges low cholesterol diet. Today's lipids are near goal with Cholesterol 193; HDL 81; LDL 105; Triglycerides 36 on 02/05/2015.     Patient has hx/o prediabetes predating since Sept 2013 with A1c 5.7% and then 5.3% in ,may 2014. She apparently had no glucose intolerance during her recent pregnancy and patient denies reactive hypoglycemic symptoms, visual blurring, diabetic polys, or paresthesias. Today's A1c is  5.5 %.    Finally, patient has history of Vitamin D Deficiency of 54 on treatment in 2013 and current  Vitamin D is low at  34 taking only a preNatal supplement..     Medication Sig  . VITAMIN D  Take 1,000 Units by mouth daily.  Phylis Bougie. MULTIVITAMIN-PRENATAL  Take 1 tablet by mouth daily at 12 noon.   No Known Allergies   Past Medical History  Diagnosis Date  . Prediabetes   . Vitamin D deficiency    Health Maintenance  Topic Date Due  . HIV Screening  08/03/1994  . PAP SMEAR  08/02/2000  . INFLUENZA VACCINE  11/10/2015  . TETANUS/TDAP  01/23/2023   Immunization History  Administered Date(s) Administered  . Influenza Split 02/05/2015  . Influenza-Unspecified 01/07/2014  . PPD Test  01/27/2014, 02/05/2015  . Pneumococcal Conjugate-13 01/27/2014  . Tdap 01/22/2013   History reviewed. No pertinent past surgical history. History reviewed. No pertinent family history. Social History  Substance Use Topics  . Smoking status: Never Smoker   . Smokeless tobacco: Never Used  . Alcohol Use: No     Comment: patient breastfeeding her baby    ROS Constitutional: Denies fever, chills, weight loss/gain, headaches, insomnia,  night sweats, and change in appetite. Does c/o fatigue. Eyes: Denies redness, blurred vision, diplopia, discharge, itchy, watery eyes.  ENT: Denies discharge, congestion, post nasal drip, epistaxis, sore throat, earache, hearing loss, dental pain, Tinnitus, Vertigo, Sinus pain, snoring.  Cardio: Denies chest pain, palpitations, irregular heartbeat, syncope, dyspnea, diaphoresis, orthopnea, PND, claudication, edema Respiratory: denies cough, dyspnea, DOE, pleurisy, hoarseness, laryngitis, wheezing.  Gastrointestinal: Denies dysphagia, heartburn, reflux, water brash, pain, cramps, nausea, vomiting, bloating, diarrhea, constipation, hematemesis, melena, hematochezia, jaundice, hemorrhoids Genitourinary: Denies dysuria, frequency, urgency, nocturia, hesitancy, discharge, hematuria, flank pain Breast: Breast lumps, nipple discharge, bleeding.  Musculoskeletal: Denies arthralgia, myalgia, stiffness, Jt. Swelling, pain, limp, and strain/sprain. Denies falls. Skin: Denies puritis, rash, hives, warts, acne, eczema, changing in skin lesion Neuro: No weakness, tremor, incoordination, spasms, paresthesia, pain Psychiatric: Denies confusion, memory loss, sensory loss. Denies Depression. Endocrine: Denies change in weight, skin, hair change, nocturia, and paresthesia, diabetic polys, visual blurring, hyper / hypo glycemic episodes.  Heme/Lymph: No excessive bleeding, bruising, enlarged lymph nodes.  Physical Exam  BP 102/64 mmHg  Pulse 76  Temp(Src) 97.7 F (36.5 C)   Resp 16  Ht 5' 9.5" (1.765 m)  Wt 112 lb 12.8 oz (51.166 kg)  BMI 16.42 kg/m2  LMP 02/02/2014  General Appearance: Well nourished and in no apparent distress. Eyes: PERRLA, EOMs, conjunctiva no swelling or erythema, normal fundi and vessels. Sinuses: No frontal/maxillary tenderness ENT/Mouth: EACs patent / TMs  nl. Nares clear without erythema, swelling, mucoid exudates. Oral hygiene is good. No erythema, swelling, or exudate. Tongue normal, non-obstructing. Tonsils not swollen or erythematous. Hearing normal.  Neck: Supple, thyroid normal. No bruits, nodes or JVD. Respiratory: Respiratory effort normal.  BS equal and clear bilateral without rales, rhonci, wheezing or stridor. Cardio: Heart sounds are normal with regular rate and rhythm and no murmurs, rubs or gallops. Peripheral pulses are normal and equal bilaterally without edema. No aortic or femoral bruits. Chest: symmetric with normal excursions and percussion. Breasts: Deferred to GYN.  Abdomen: Flat, soft, with bowel sounds. Nontender, no guarding, rebound, hernias, masses, or organomegaly.  Lymphatics: Non tender without lymphadenopathy.  Genitourinary: Deferred to GYN. Musculoskeletal: Full ROM all peripheral extremities, joint stability, 5/5 strength, and normal gait. Skin: Warm and dry without rashes, lesions, cyanosis, clubbing or  ecchymosis.  Neuro: Cranial nerves intact, reflexes equal bilaterally. Normal muscle tone, no cerebellar symptoms. Sensation intact.  Pysch: Alert and oriented X 3, normal affect, Insight and Judgment appropriate.   Assessment and Plan  1. Encounter for general adult medical examination with abnormal findings  - Iron and TIBC - Vitamin B12 - Urinalysis, Routine w reflex microscopic  - CBC with Differential/Platelet - BASIC METABOLIC PANEL WITH GFR - Hepatic function panel - Magnesium - Lipid panel - TSH - Hemoglobin A1c - Insulin, random - Vit D  25 hydroxy   2. Prediabetes  -  Hemoglobin A1c - Insulin, random  3. Vitamin D deficiency  - Vit D  25 hydroxy   4. Hyperlipidemia  - Lipid panel - TSH  5. Other fatigue  - Iron and TIBC - Vitamin B12 - CBC with Differential/Platelet - TSH  6. Need for prophylactic vaccination and inoculation against influenza  - Flu vaccine > 3yo with preservative IM (Fluvirin Influenza Split)  7. Screening examination for pulmonary tuberculosis  - PPD  8. Medication management  - Urinalysis, Routine w reflex microscopic  - CBC with Differential/Platelet - BASIC METABOLIC PANEL WITH GFR - Hepatic function panel - Magnesium  9. BMI less than 19,adult  - Patient has a thin frame and is physically fit & appears very healthy   Continue prudent diet as discussed, weight control, BP monitoring, regular exercise, and medications. Discussed med's effects and SE's. Screening labs and tests as requested with regular follow-up as recommended.  Over 40 minutes of exam, counseling, chart review was performed.

## 2015-07-16 ENCOUNTER — Encounter (HOSPITAL_COMMUNITY): Payer: Self-pay | Admitting: *Deleted

## 2016-03-03 NOTE — Progress Notes (Signed)
[facility] Lucky CowboyWilliam Ashika Apuzzo, M.D.    Dyanne CarrelAmanda R. Steffanie Dunnollier, P.A.-C      Terri Piedraourtney Forcucci, P.A.-C  [facility]                [address]                [place], [place] [id number] Telephone [phone] Telefax [phone]  Annual Screening/Preventative Visit & Comprehensive Evaluation &  Examination     This very nice 35 y.o. MWF presents for a Screening Preventative Visit. Patient has been followed for BP screening, Prediabetes, Hyperlipidemia and Vitamin D Deficiency.      Patient's BP has been controlled at home and patient denies any cardiac symptoms as chest pain, palpitations, shortness of breath, dizziness or ankle swelling. Today's  BP is 96/58.       Patient's  Has hx/o mildly elevated LDL's attempting control with diet (LDL 146 in 2007 and 65 in [id number]). Last lipids were near goal: Lab Results  Component Value Date   CHOL 193 02/05/2015   HDL 81 02/05/2015   LDLCALC 105 02/05/2015   TRIG 36 02/05/2015   CHOLHDL 2.4 02/05/2015      Patient has  prediabetes predating circa 2013 with A1c 5.7% and then 5.5% in 2014 . Patient denies reactive hypoglycemic symptoms, visual blurring, diabetic polys, or paresthesias. Last A1c was at goal: Lab Results  Component Value Date   HGBA1C 5.5 02/05/2015      Finally, patient has history of Vitamin D Deficiency and last Vitamin D was not at goal: Lab Results  Component Value Date   VD25OH 35 02/05/2015   Current Outpatient Prescriptions on File Prior to Visit  Medication Sig  . ORTHO MICRONOR 0.35 MG  TAKE 1 TAB DAILY.   Vitamin D ? 1000 u/day TAKE 1 capsule daily   . PRENATAL VITAMINS  Take 1 tablet daily   No Known Allergies   Past Medical History:  Diagnosis Date  . Prediabetes   . Vitamin D deficiency    Health Maintenance  Topic Date Due  . HIV Screening  08/03/1994  . PAP SMEAR  08/02/2000  . TETANUS/TDAP  01/23/2023  . INFLUENZA VACCINE  Completed    Immunization History  Administered Date(s) Administered  . Influenza Split 02/05/2015  . Influenza-Unspecified 01/07/2014, 12/25/2015  . PPD Test 01/27/2014, 02/05/2015  . Pneumococcal Conjugate-13 01/27/2014  . Tdap 01/22/2013   Social History  Substance Use Topics  . Smoking status: Never Smoker  . Smokeless tobacco: Never Used  . Alcohol use No     Comment: patient breastfeeding her baby    ROS Constitutional: Denies fever, chills, weight loss/gain, headaches, insomnia,  night sweats, and change in appetite. Does c/o fatigue. Eyes: Denies redness, blurred vision, diplopia, discharge, itchy, watery eyes.  ENT: Denies discharge, congestion, post nasal drip, epistaxis, sore throat, earache, hearing loss, dental pain, Tinnitus, Vertigo, Sinus pain, snoring.  Cardio: Denies chest pain, palpitations, irregular heartbeat, syncope, dyspnea, diaphoresis, orthopnea, PND, claudication, edema Respiratory: denies cough, dyspnea, DOE, pleurisy, hoarseness, laryngitis, wheezing.  Gastrointestinal: Denies dysphagia, heartburn, reflux, water brash, pain, cramps, nausea, vomiting, bloating, diarrhea, constipation, hematemesis, melena, hematochezia, jaundice, hemorrhoids Genitourinary: Denies dysuria, frequency, urgency, nocturia, hesitancy, discharge, hematuria, flank pain Breast: Breast lumps, nipple discharge, bleeding.  Musculoskeletal: Denies arthralgia, myalgia, stiffness, Jt. Swelling, pain, limp, and strain/sprain. Denies falls. Skin: Denies puritis, rash, hives, warts, acne, eczema, changing in skin lesion Neuro: No weakness, tremor, incoordination, spasms, paresthesia, pain  Psychiatric: Denies confusion, memory loss, sensory loss. Denies Depression. Endocrine: Denies change in weight, skin, hair change, nocturia, and paresthesia, diabetic polys, visual blurring, hyper / hypo glycemic episodes.  Heme/Lymph: No excessive bleeding, bruising, enlarged lymph nodes.  Physical Exam  BP (!)  96/58   Pulse 64   Temp 97.6 F (36.4 C)   Resp 16   Ht 5\' 9"  (1.753 m)   Wt 109 lb 3.2 oz (49.5 kg)   BMI 16.13 kg/m   General Appearance: Well nourished and in no apparent distress.  Eyes: PERRLA, EOMs, conjunctiva no swelling or erythema, normal fundi and vessels. Sinuses: No frontal/maxillary tenderness ENT/Mouth: EACs patent / TMs  nl. Nares clear without erythema, swelling, mucoid exudates. Oral hygiene is good. No erythema, swelling, or exudate. Tongue normal, non-obstructing. Tonsils not swollen or erythematous. Hearing normal.  Neck: Supple, thyroid normal. No bruits, nodes or JVD. Respiratory: Respiratory effort normal.  BS equal and clear bilateral without rales, rhonci, wheezing or stridor. Cardio: Heart sounds are normal with regular rate and rhythm and no murmurs, rubs or gallops. Peripheral pulses are normal and equal bilaterally without edema. No aortic or femoral bruits. Chest: symmetric with normal excursions and percussion. Breasts: Deferred to GYN. Abdomen: Flat, soft with bowel sounds active. Nontender, no guarding, rebound, hernias, masses, or organomegaly.  Lymphatics: Non tender without lymphadenopathy.  Genitourinary: Deferred to GYN. Musculoskeletal: Full ROM all peripheral extremities, joint stability, 5/5 strength, and normal gait. Skin: Warm and dry without rashes, lesions, cyanosis, clubbing or  ecchymosis.  Neuro: Cranial nerves intact, reflexes equal bilaterally. Normal muscle tone, no cerebellar symptoms. Sensation intact.  Pysch: Alert and oriented X 3, normal affect, Insight and Judgment appropriate.   Assessment and Plan  1. Annual Preventative Screening Examination  - Microalbumin / creatinine urine ratio - Urinalysis, Routine w reflex microscopic - Vitamin B12 - Iron and TIBC - CBC with Differential/Platelet - BASIC METABOLIC PANEL WITH GFR - Hepatic function panel - Magnesium - Lipid panel - TSH - Hemoglobin A1c - Insulin, random -  VITAMIN D 25 Hydroxy   2. Elevated BP without diagnosis of hypertension  - Microalbumin / creatinine urine ratio - TSH  3. Mixed hyperlipidemia  - Lipid panel - TSH  4. Prediabetes  - Hemoglobin A1c - Insulin, random  5. Vitamin D deficiency  - VITAMIN D 25 Hydroxy   6. Screening examination for pulmonary tuberculosis  - PPD  7. Other fatigue  - Vitamin B12 - Iron and TIBC - CBC with Differential/Platelet - TSH  8. Medication management  - Urinalysis, Routine w reflex microscopic  - BASIC METABOLIC PANEL WITH GFR - Hepatic function panel - Magnesium       Continue prudent diet as discussed, weight control, BP monitoring, regular exercise, and medications. Discussed med's effects and SE's. Screening labs and tests as requested with regular follow-up as recommended. Over 40 minutes of exam, counseling, chart review and high complex critical decision making was performed.

## 2016-03-03 NOTE — Patient Instructions (Signed)
Preventive Care for Adults  A healthy lifestyle and preventive care can promote health and wellness. Preventive health guidelines for women include the following key practices.  A routine yearly physical is a good way to check with your health care provider about your health and preventive screening. It is a chance to share any concerns and updates on your health and to receive a thorough exam.  Visit your dentist for a routine exam and preventive care every 6 months. Brush your teeth twice a day and floss once a day. Good oral hygiene prevents tooth decay and gum disease.  The frequency of eye exams is based on your age, health, family medical history, use of contact lenses, and other factors. Follow your health care provider's recommendations for frequency of eye exams.  Eat a healthy diet. Foods like vegetables, fruits, whole grains, low-fat dairy products, and lean protein foods contain the nutrients you need without too many calories. Decrease your intake of foods high in solid fats, added sugars, and salt. Eat the right amount of calories for you.Get information about a proper diet from your health care provider, if necessary.  Regular physical exercise is one of the most important things you can do for your health. Most adults should get at least 150 minutes of moderate-intensity exercise (any activity that increases your heart rate and causes you to sweat) each week. In addition, most adults need muscle-strengthening exercises on 2 or more days a week.  Maintain a healthy weight. The body mass index (BMI) is a screening tool to identify possible weight problems. It provides an estimate of body fat based on height and weight. Your health care provider can find your BMI and can help you achieve or maintain a healthy weight.For adults 20 years and older:  A BMI below 18.5 is considered underweight.  A BMI of 18.5 to 24.9 is normal.  A BMI of 25 to 29.9 is considered overweight.  A BMI of  30 and above is considered obese.  Maintain normal blood lipids and cholesterol levels by exercising and minimizing your intake of saturated fat. Eat a balanced diet with plenty of fruit and vegetables. Blood tests for lipids and cholesterol should begin at age 20 and be repeated every 5 years. If your lipid or cholesterol levels are high, you are over 50, or you are at high risk for heart disease, you may need your cholesterol levels checked more frequently.Ongoing high lipid and cholesterol levels should be treated with medicines if diet and exercise are not working.  If you smoke, find out from your health care provider how to quit. If you do not use tobacco, do not start.  Lung cancer screening is recommended for adults aged 55-80 years who are at high risk for developing lung cancer because of a history of smoking. A yearly low-dose CT scan of the lungs is recommended for people who have at least a 30-pack-year history of smoking and are a current smoker or have quit within the past 15 years. A pack year of smoking is smoking an average of 1 pack of cigarettes a day for 1 year (for example: 1 pack a day for 30 years or 2 packs a day for 15 years). Yearly screening should continue until the smoker has stopped smoking for at least 15 years. Yearly screening should be stopped for people who develop a health problem that would prevent them from having lung cancer treatment.  If you are pregnant, do not drink alcohol. If you are   breastfeeding, be very cautious about drinking alcohol. If you are not pregnant and choose to drink alcohol, do not have more than 1 drink per day. One drink is considered to be 12 ounces (355 mL) of beer, 5 ounces (148 mL) of wine, or 1.5 ounces (44 mL) of liquor.  Avoid use of street drugs. Do not share needles with anyone. Ask for help if you need support or instructions about stopping the use of drugs.  High blood pressure causes heart disease and increases the risk of  stroke. Your blood pressure should be checked at least every 1 to 2 years. Ongoing high blood pressure should be treated with medicines if weight loss and exercise do not work.  If you are 3-31 years old, ask your health care provider if you should take aspirin to prevent strokes.  Diabetes screening involves taking a blood sample to check your fasting blood sugar level. This should be done once every 3 years, after age 31, if you are within normal weight and without risk factors for diabetes. Testing should be considered at a younger age or be carried out more frequently if you are overweight and have at least 1 risk factor for diabetes.  Breast cancer screening is essential preventive care for women. You should practice "breast self-awareness." This means understanding the normal appearance and feel of your breasts and may include breast self-examination. Any changes detected, no matter how small, should be reported to a health care provider. Women in their 76s and 30s should have a clinical breast exam (CBE) by a health care provider as part of a regular health exam every 1 to 3 years. After age 65, women should have a CBE every year. Starting at age 67, women should consider having a mammogram (breast X-ray test) every year. Women who have a family history of breast cancer should talk to their health care provider about genetic screening. Women at a high risk of breast cancer should talk to their health care providers about having an MRI and a mammogram every year.  Breast cancer gene (BRCA)-related cancer risk assessment is recommended for women who have family members with BRCA-related cancers. BRCA-related cancers include breast, ovarian, tubal, and peritoneal cancers. Having family members with these cancers may be associated with an increased risk for harmful changes (mutations) in the breast cancer genes BRCA1 and BRCA2. Results of the assessment will determine the need for genetic counseling and  BRCA1 and BRCA2 testing.  Routine pelvic exams to screen for cancer are no longer recommended for nonpregnant women who are considered low risk for cancer of the pelvic organs (ovaries, uterus, and vagina) and who do not have symptoms. Ask your health care provider if a screening pelvic exam is right for you.  If you have had past treatment for cervical cancer or a condition that could lead to cancer, you need Pap tests and screening for cancer for at least 20 years after your treatment. If Pap tests have been discontinued, your risk factors (such as having a new sexual partner) need to be reassessed to determine if screening should be resumed. Some women have medical problems that increase the chance of getting cervical cancer. In these cases, your health care provider may recommend more frequent screening and Pap tests.  The HPV test is an additional test that may be used for cervical cancer screening. The HPV test looks for the virus that can cause the cell changes on the cervix. The cells collected during the Pap test can  be tested for HPV. The HPV test could be used to screen women aged 98 years and older, and should be used in women of any age who have unclear Pap test results. After the age of 59, women should have HPV testing at the same frequency as a Pap test.  Colorectal cancer can be detected and often prevented. Most routine colorectal cancer screening begins at the age of 35 years and continues through age 61 years. However, your health care provider may recommend screening at an earlier age if you have risk factors for colon cancer. On a yearly basis, your health care provider may provide home test kits to check for hidden blood in the stool. Use of a small camera at the end of a tube, to directly examine the colon (sigmoidoscopy or colonoscopy), can detect the earliest forms of colorectal cancer. Talk to your health care provider about this at age 18, when routine screening begins. Direct  exam of the colon should be repeated every 5-10 years through age 67 years, unless early forms of pre-cancerous polyps or small growths are found.  People who are at an increased risk for hepatitis B should be screened for this virus. You are considered at high risk for hepatitis B if:  You were born in a country where hepatitis B occurs often. Talk with your health care provider about which countries are considered high risk.  Your parents were born in a high-risk country and you have not received a shot to protect against hepatitis B (hepatitis B vaccine).  You have HIV or AIDS.  You use needles to inject street drugs.  You live with, or have sex with, someone who has hepatitis B.  You get hemodialysis treatment.  You take certain medicines for conditions like cancer, organ transplantation, and autoimmune conditions.  Hepatitis C blood testing is recommended for all people born from [birth year] through 1965 and any individual with known risks for hepatitis C.  Practice safe sex. Use condoms and avoid high-risk sexual practices to reduce the spread of sexually transmitted infections (STIs). STIs include gonorrhea, chlamydia, syphilis, trichomonas, herpes, HPV, and human immunodeficiency virus (HIV). Herpes, HIV, and HPV are viral illnesses that have no cure. They can result in disability, cancer, and death.  You should be screened for sexually transmitted illnesses (STIs) including gonorrhea and chlamydia if:  You are sexually active and are younger than 24 years.  You are older than 24 years and your health care provider tells you that you are at risk for this type of infection.  Your sexual activity has changed since you were last screened and you are at an increased risk for chlamydia or gonorrhea. Ask your health care provider if you are at risk.  If you are at risk of being infected with HIV, it is recommended that you take a prescription medicine daily to prevent HIV infection. This is  called preexposure prophylaxis (PrEP). You are considered at risk if:  You are a heterosexual woman, are sexually active, and are at increased risk for HIV infection.  You take drugs by injection.  You are sexually active with a partner who has HIV.  Talk with your health care provider about whether you are at high risk of being infected with HIV. If you choose to begin PrEP, you should first be tested for HIV. You should then be tested every 3 months for as long as you are taking PrEP.  Osteoporosis is a disease in which the bones lose minerals and  strength with aging. This can result in serious bone fractures or breaks. The risk of osteoporosis can be identified using a bone density scan. Women ages 48 years and over and women at risk for fractures or osteoporosis should discuss screening with their health care providers. Ask your health care provider whether you should take a calcium supplement or vitamin D to reduce the rate of osteoporosis.  Menopause can be associated with physical symptoms and risks. Hormone replacement therapy is available to decrease symptoms and risks. You should talk to your health care provider about whether hormone replacement therapy is right for you.  Use sunscreen. Apply sunscreen liberally and repeatedly throughout the day. You should seek shade when your shadow is shorter than you. Protect yourself by wearing long sleeves, pants, a wide-brimmed hat, and sunglasses year round, whenever you are outdoors.  Once a month, do a whole body skin exam, using a mirror to look at the skin on your back. Tell your health care provider of new moles, moles that have irregular borders, moles that are larger than a pencil eraser, or moles that have changed in shape or color.  Stay current with required vaccines (immunizations).  Influenza vaccine. All adults should be immunized every year.  Tetanus, diphtheria, and acellular pertussis (Td, Tdap) vaccine. Pregnant women should  receive 1 dose of Tdap vaccine during each pregnancy. The dose should be obtained regardless of the length of time since the last dose. Immunization is preferred during the 27th-36th week of gestation. An adult who has not previously received Tdap or who does not know her vaccine status should receive 1 dose of Tdap. This initial dose should be followed by tetanus and diphtheria toxoids (Td) booster doses every 10 years. Adults with an unknown or incomplete history of completing a 3-dose immunization series with Td-containing vaccines should begin or complete a primary immunization series including a Tdap dose. Adults should receive a Td booster every 10 years.  Varicella vaccine. An adult without evidence of immunity to varicella should receive 2 doses or a second dose if she has previously received 1 dose. Pregnant females who do not have evidence of immunity should receive the first dose after pregnancy. This first dose should be obtained before leaving the health care facility. The second dose should be obtained 4-8 weeks after the first dose.  Human papillomavirus (HPV) vaccine. Females aged 13-26 years who have not received the vaccine previously should obtain the 3-dose series. The vaccine is not recommended for use in pregnant females. However, pregnancy testing is not needed before receiving a dose. If a female is found to be pregnant after receiving a dose, no treatment is needed. In that case, the remaining doses should be delayed until after the pregnancy. Immunization is recommended for any person with an immunocompromised condition through the age of 48 years if she did not get any or all doses earlier. During the 3-dose series, the second dose should be obtained 4-8 weeks after the first dose. The third dose should be obtained 24 weeks after the first dose and 16 weeks after the second dose.  Zoster vaccine. One dose is recommended for adults aged 67 years or older unless certain conditions are  present.  Measles, mumps, and rubella (MMR) vaccine. Adults born before [birth year] generally are considered immune to measles and mumps. Adults born in [birth year] or later should have 1 or more doses of MMR vaccine unless there is a contraindication to the vaccine or there is laboratory evidence of immunity  to each of the three diseases. A routine second dose of MMR vaccine should be obtained at least 28 days after the first dose for students attending postsecondary schools, health care workers, or international travelers. People who received inactivated measles vaccine or an unknown type of measles vaccine during 1963-1967 should receive 2 doses of MMR vaccine. People who received inactivated mumps vaccine or an unknown type of mumps vaccine before 1979 and are at high risk for mumps infection should consider immunization with 2 doses of MMR vaccine. For females of childbearing age, rubella immunity should be determined. If there is no evidence of immunity, females who are not pregnant should be vaccinated. If there is no evidence of immunity, females who are pregnant should delay immunization until after pregnancy. Unvaccinated health care workers born before [birth year] who lack laboratory evidence of measles, mumps, or rubella immunity or laboratory confirmation of disease should consider measles and mumps immunization with 2 doses of MMR vaccine or rubella immunization with 1 dose of MMR vaccine.  Pneumococcal 13-valent conjugate (PCV13) vaccine. When indicated, a person who is uncertain of her immunization history and has no record of immunization should receive the PCV13 vaccine. An adult aged 58 years or older who has certain medical conditions and has not been previously immunized should receive 1 dose of PCV13 vaccine. This PCV13 should be followed with a dose of pneumococcal polysaccharide (PPSV23) vaccine. The PPSV23 vaccine dose should be obtained at least 8 weeks after the dose of PCV13 vaccine. An adult aged 65  years or older who has certain medical conditions and previously received 1 or more doses of PPSV23 vaccine should receive 1 dose of PCV13. The PCV13 vaccine dose should be obtained 1 or more years after the last PPSV23 vaccine dose.  Pneumococcal polysaccharide (PPSV23) vaccine. When PCV13 is also indicated, PCV13 should be obtained first. All adults aged 18 years and older should be immunized. An adult younger than age 74 years who has certain medical conditions should be immunized. Any person who resides in a nursing home or long-term care facility should be immunized. An adult smoker should be immunized. People with an immunocompromised condition and certain other conditions should receive both PCV13 and PPSV23 vaccines. People with human immunodeficiency virus (HIV) infection should be immunized as soon as possible after diagnosis. Immunization during chemotherapy or radiation therapy should be avoided. Routine use of PPSV23 vaccine is not recommended for American Indians, [place] Natives, or people younger than 65 years unless there are medical conditions that require PPSV23 vaccine. When indicated, people who have unknown immunization and have no record of immunization should receive PPSV23 vaccine. One-time revaccination 5 years after the first dose of PPSV23 is recommended for people aged 19-64 years who have chronic kidney failure, nephrotic syndrome, asplenia, or immunocompromised conditions. People who received 1-2 doses of PPSV23 before age 21 years should receive another dose of PPSV23 vaccine at age 42 years or later if at least 5 years have passed since the previous dose. Doses of PPSV23 are not needed for people immunized with PPSV23 at or after age 61 years.  Meningococcal vaccine. Adults with asplenia or persistent complement component deficiencies should receive 2 doses of quadrivalent meningococcal conjugate (MenACWY-D) vaccine. The doses should be obtained at least 2 months apart.  Microbiologists working with certain meningococcal bacteria, [employer], people at risk during an outbreak, and people who travel to or live in countries with a high rate of meningitis should be immunized. A first-year college student up through  age 23 years who is living in a residence hall should receive a dose if she did not receive a dose on or after her 16th birthday. Adults who have certain high-risk conditions should receive one or more doses of vaccine.  Hepatitis A vaccine. Adults who wish to be protected from this disease, have certain high-risk conditions, work with hepatitis A-infected animals, work in hepatitis A research labs, or travel to or work in countries with a high rate of hepatitis A should be immunized. Adults who were previously unvaccinated and who anticipate close contact with an international adoptee during the first 60 days after arrival in the [place] from a country with a high rate of hepatitis A should be immunized.  Hepatitis B vaccine. Adults who wish to be protected from this disease, have certain high-risk conditions, may be exposed to blood or other infectious body fluids, are household contacts or sex partners of hepatitis B positive people, are clients or workers in certain care facilities, or travel to or work in countries with a high rate of hepatitis B should be immunized.  Haemophilus influenzae type b (Hib) vaccine. A previously unvaccinated person with asplenia or sickle cell disease or having a scheduled splenectomy should receive 1 dose of Hib vaccine. Regardless of previous immunization, a recipient of a hematopoietic stem cell transplant should receive a 3-dose series 6-12 months after her successful transplant. Hib vaccine is not recommended for adults with HIV infection. Preventive Services / Frequency  Ages 65 to 22 years  Blood pressure check.  Lipid and cholesterol check.  Clinical breast exam.** / Every 3 years for women in their 53s  and 35s.  BRCA-related cancer risk assessment.** / For women who have family members with a BRCA-related cancer (breast, ovarian, tubal, or peritoneal cancers).  Pap test.** / Every 2 years from ages 41 through 44. Every 3 years starting at age 64 through age 28 or 21 with a history of 3 consecutive normal Pap tests.  HPV screening.** / Every 3 years from ages 9 through ages 56 to 48 with a history of 3 consecutive normal Pap tests.  Hepatitis C blood test.** / For any individual with known risks for hepatitis C.  Skin self-exam. / Monthly.  Influenza vaccine. / Every year.  Tetanus, diphtheria, and acellular pertussis (Tdap, Td) vaccine.** / Consult your health care provider. Pregnant women should receive 1 dose of Tdap vaccine during each pregnancy. 1 dose of Td every 10 years.  Varicella vaccine.** / Consult your health care provider. Pregnant females who do not have evidence of immunity should receive the first dose after pregnancy.  HPV vaccine. / 3 doses over 6 months, if 67 and younger. The vaccine is not recommended for use in pregnant females. However, pregnancy testing is not needed before receiving a dose.  Measles, mumps, rubella (MMR) vaccine.** / You need at least 1 dose of MMR if you were born in 1957 or later. You may also need a 2nd dose. For females of childbearing age, rubella immunity should be determined. If there is no evidence of immunity, females who are not pregnant should be vaccinated. If there is no evidence of immunity, females who are pregnant should delay immunization until after pregnancy.  Pneumococcal 13-valent conjugate (PCV13) vaccine.** / Consult your health care provider.  Pneumococcal polysaccharide (PPSV23) vaccine.** / 1 to 2 doses if you smoke cigarettes or if you have certain conditions.  Meningococcal vaccine.** / 1 dose if you are age 61 to 21 years and  a Market researcher living in a residence hall, or have one of several medical  conditions, you need to get vaccinated against meningococcal disease. You may also need additional booster doses.  Hepatitis A vaccine.** / Consult your health care provider.  Hepatitis B vaccine.** / Consult your health care provider.  Haemophilus influenzae type b (Hib) vaccine.** / Consult your health care provider. ++++++++ Recommend Adult Low Dose Aspirin or  coated  Aspirin 81 mg daily  To reduce risk of Colon Cancer 20 %,  Skin Cancer 26 % ,  Melanoma 46%  and  Pancreatic cancer 60% ++++++++++++++++++ Vitamin D goal  is between 70-100.  Please make sure that you are taking your Vitamin D as directed.  It is very important as a natural anti-inflammatory  helping hair, skin, and nails, as well as reducing stroke and heart attack risk.  It helps your bones and helps with mood. It also decreases numerous cancer risks so please take it as directed.  Low Vit D is associated with a 200-300% higher risk for CANCER  and 200-300% higher risk for HEART   ATTACK  &  STROKE.   .....................................Marland Kitchen It is also associated with higher death rate at younger ages,  autoimmune diseases like Rheumatoid arthritis, Lupus, Multiple Sclerosis.    Also many other serious conditions, like depression, Alzheimer's Dementia, infertility, muscle aches, fatigue, fibromyalgia - just to name a few. ++++++++++++++++++++ Recommend the book "The END of DIETING" by Dr Excell Seltzer  & the book "The END of DIABETES " by Dr Excell Seltzer At [email or web address] - get book & Audio CD's    Being diabetic has a  300% increased risk for heart attack, stroke, cancer, and alzheimer- type vascular dementia. It is very important that you work harder with diet by avoiding all foods that are white. Avoid white rice (brown & wild rice is OK), white potatoes (sweetpotatoes in moderation is OK), White bread or wheat bread or anything made out of white flour like bagels, donuts, rolls, buns, biscuits, cakes, pastries,  cookies, pizza crust, and pasta (made from white flour & egg whites) - vegetarian pasta or spinach or wheat pasta is OK. Multigrain breads like Arnold's or [employer], or multigrain sandwich thins or flatbreads.  Diet, exercise and weight loss can reverse and cure diabetes in the early stages.  Diet, exercise and weight loss is very important in the control and prevention of complications of diabetes which affects every system in your body, ie. Brain - dementia/stroke, eyes - glaucoma/blindness, heart - heart attack/heart failure, kidneys - dialysis, stomach - gastric paralysis, intestines - malabsorption, nerves - severe painful neuritis, circulation - gangrene & loss of a leg(s), and finally cancer and Alzheimers.    I recommend avoid fried & greasy foods,  sweets/candy, white rice (brown or wild rice or Quinoa is OK), white potatoes (sweet potatoes are OK) - anything made from white flour - bagels, doughnuts, rolls, buns, biscuits,white and wheat breads, pizza crust and traditional pasta made of white flour & egg white(vegetarian pasta or spinach or wheat pasta is OK).  Multi-grain bread is OK - like multi-grain flat bread or sandwich thins. Avoid alcohol in excess. Exercise is also important.    Eat all the vegetables you want - avoid meat, especially red meat and dairy - especially cheese.  Cheese is the most concentrated form of trans-fats which is the worst thing to clog up our arteries. Veggie cheese is OK which can be found in the fresh produce  section at [employer] or [employer] or [employer]  ++++++++++++++++++++ DASH Eating Plan  DASH stands for "Dietary Approaches to Stop Hypertension."   The DASH eating plan is a healthy eating plan that has been shown to reduce high blood pressure (hypertension). Additional health benefits may include reducing the risk of type 2 diabetes mellitus, heart disease, and stroke. The DASH eating plan may also help with weight loss. WHAT DO I NEED TO KNOW  ABOUT THE DASH EATING PLAN? For the DASH eating plan, you will follow these general guidelines:  Choose foods with a percent daily value for sodium of less than 5% (as listed on the food label).  Use salt-free seasonings or herbs instead of table salt or sea salt.  Check with your health care provider or pharmacist before using salt substitutes.  Eat lower-sodium products, often labeled as "lower sodium" or "no salt added."  Eat fresh foods.  Eat more vegetables, fruits, and low-fat dairy products.  Choose whole grains. Look for the word "whole" as the first word in the ingredient list.  Choose fish   Limit sweets, desserts, sugars, and sugary drinks.  Choose heart-healthy fats.  Eat veggie cheese   Eat more home-cooked food and less restaurant, buffet, and fast food.  Limit fried foods.  Cook foods using methods other than frying.  Limit canned vegetables. If you do use them, rinse them well to decrease the sodium.  When eating at a restaurant, ask that your food be prepared with less salt, or no salt if possible.                      WHAT FOODS CAN I EAT? Read Dr Joel Fuhrman's books on The End of Dieting & The End of Diabetes  Grains Whole grain or whole wheat bread. Brown rice. Whole grain or whole wheat pasta. Quinoa, bulgur, and whole grain cereals. Low-sodium cereals. Corn or whole wheat flour tortillas. Whole grain cornbread. Whole grain crackers. Low-sodium crackers.  Vegetables Fresh or frozen vegetables (raw, steamed, roasted, or grilled). Low-sodium or reduced-sodium tomato and vegetable juices. Low-sodium or reduced-sodium tomato sauce and paste. Low-sodium or reduced-sodium canned vegetables.   Fruits All fresh, canned (in natural juice), or frozen fruits.  Protein Products  All fish and seafood.  Dried beans, peas, or lentils. Unsalted nuts and seeds. Unsalted canned beans.  Dairy Low-fat dairy products, such as skim or 1% milk, 2% or reduced-fat  cheeses, low-fat ricotta or cottage cheese, or plain low-fat yogurt. Low-sodium or reduced-sodium cheeses.  Fats and Oils Tub margarines without trans fats. Light or reduced-fat mayonnaise and salad dressings (reduced sodium). Avocado. Safflower, olive, or canola oils. Natural peanut or almond butter.  Other Unsalted popcorn and pretzels. The items listed above may not be a complete list of recommended foods or beverages. Contact your dietitian for more options.  +++++++++++++++++++  WHAT FOODS ARE NOT RECOMMENDED? Grains/ White flour or wheat flour White bread. White pasta. White rice. Refined cornbread. Bagels and croissants. Crackers that contain trans fat.  Vegetables  Creamed or fried vegetables. Vegetables in a . Regular canned vegetables. Regular canned tomato sauce and paste. Regular tomato and vegetable juices.  Fruits Dried fruits. Canned fruit in light or heavy syrup. Fruit juice.  Meat and Other Protein Products Meat in general - RED meat & White meat.  Fatty cuts of meat. Ribs, chicken wings, all processed meats as bacon, sausage, bologna, salami, fatback, hot dogs, bratwurst and packaged luncheon meats.  Dairy Whole   or 2% milk, cream, half-and-half, and cream cheese. Whole-fat or sweetened yogurt. Full-fat cheeses or blue cheese. Non-dairy creamers and whipped toppings. Processed cheese, cheese spreads, or cheese curds.  Condiments Onion and garlic salt, seasoned salt, table salt, and sea salt. Canned and packaged gravies. Worcestershire sauce. Tartar sauce. Barbecue sauce. Teriyaki sauce. Soy sauce, including reduced sodium. Steak sauce. Fish sauce. Oyster sauce. Cocktail sauce. Horseradish. Ketchup and mustard. Meat flavorings and tenderizers. Bouillon cubes. Hot sauce. Tabasco sauce. Marinades. Taco seasonings. Relishes.  Fats and Oils Butter, stick margarine, lard, shortening and bacon fat. Coconut, palm kernel, or palm oils. Regular salad dressings.  Pickles and  olives. Salted popcorn and pretzels.  The items listed above may not be a complete list of foods and beverages to avoid.    

## 2016-03-04 ENCOUNTER — Encounter: Payer: Self-pay | Admitting: Internal Medicine

## 2016-03-07 ENCOUNTER — Encounter: Payer: Self-pay | Admitting: Internal Medicine

## 2016-03-07 ENCOUNTER — Ambulatory Visit (INDEPENDENT_AMBULATORY_CARE_PROVIDER_SITE_OTHER): Payer: 59 | Admitting: Internal Medicine

## 2016-03-07 VITALS — BP 96/58 | HR 64 | Temp 97.6°F | Resp 16 | Ht 69.0 in | Wt 109.2 lb

## 2016-03-07 DIAGNOSIS — R030 Elevated blood-pressure reading, without diagnosis of hypertension: Secondary | ICD-10-CM

## 2016-03-07 DIAGNOSIS — E559 Vitamin D deficiency, unspecified: Secondary | ICD-10-CM

## 2016-03-07 DIAGNOSIS — Z111 Encounter for screening for respiratory tuberculosis: Secondary | ICD-10-CM

## 2016-03-07 DIAGNOSIS — Z79899 Other long term (current) drug therapy: Secondary | ICD-10-CM

## 2016-03-07 DIAGNOSIS — Z0001 Encounter for general adult medical examination with abnormal findings: Secondary | ICD-10-CM

## 2016-03-07 DIAGNOSIS — Z0000 Encounter for general adult medical examination without abnormal findings: Secondary | ICD-10-CM

## 2016-03-07 DIAGNOSIS — E782 Mixed hyperlipidemia: Secondary | ICD-10-CM

## 2016-03-07 DIAGNOSIS — R7303 Prediabetes: Secondary | ICD-10-CM

## 2016-03-07 DIAGNOSIS — R5383 Other fatigue: Secondary | ICD-10-CM

## 2016-03-07 LAB — HEPATIC FUNCTION PANEL
ALBUMIN: 4.6 g/dL (ref 3.6–5.1)
ALT: 14 U/L (ref 6–29)
AST: 18 U/L (ref 10–30)
Alkaline Phosphatase: 70 U/L (ref 33–115)
Bilirubin, Direct: 0.1 mg/dL (ref <=0.2)
Indirect Bilirubin: 0.6 mg/dL (ref 0.2–1.2)
Total Bilirubin: 0.7 mg/dL (ref 0.2–1.2)
Total Protein: 7.1 g/dL (ref 6.1–8.1)

## 2016-03-07 LAB — CBC WITH DIFFERENTIAL/PLATELET
BASOS ABS: 38 {cells}/uL (ref 0–200)
Basophils Relative: 1 %
EOS ABS: 38 {cells}/uL (ref 15–500)
Eosinophils Relative: 1 %
HCT: 39.1 % (ref 35.0–45.0)
Hemoglobin: 12.8 g/dL (ref 11.7–15.5)
Lymphocytes Relative: 45 %
Lymphs Abs: 1710 cells/uL (ref 850–3900)
MCH: 30.3 pg (ref 27.0–33.0)
MCHC: 32.7 g/dL (ref 32.0–36.0)
MCV: 92.7 fL (ref 80.0–100.0)
MPV: 10.8 fL (ref 7.5–12.5)
Monocytes Absolute: 380 cells/uL (ref 200–950)
Monocytes Relative: 10 %
Neutro Abs: 1634 cells/uL (ref 1500–7800)
Neutrophils Relative %: 43 %
Platelets: 169 10*3/uL (ref 140–400)
RBC: 4.22 MIL/uL (ref 3.80–5.10)
RDW: 12.9 % (ref 11.0–15.0)
WBC: 3.8 10*3/uL (ref 3.8–10.8)

## 2016-03-07 LAB — BASIC METABOLIC PANEL WITH GFR
BUN: 13 mg/dL (ref 7–25)
CO2: 28 mmol/L (ref 20–31)
CREATININE: 0.77 mg/dL (ref 0.50–1.10)
Calcium: 9.2 mg/dL (ref 8.6–10.2)
Chloride: 106 mmol/L (ref 98–110)
GFR, Est African American: >89 mL/min (ref >=60)
GFR, Est Non African American: >89 mL/min (ref >=60)
Glucose, Bld: 82 mg/dL (ref 65–99)
Potassium: 3.8 mmol/L (ref 3.5–5.3)
Sodium: 142 mmol/L (ref 135–146)

## 2016-03-07 LAB — LIPID PANEL
Cholesterol: 176 mg/dL (ref <200)
HDL: 76 mg/dL (ref >50)
LDL Cholesterol: 87 mg/dL (ref <100)
TRIGLYCERIDES: 66 mg/dL (ref <150)
Total CHOL/HDL Ratio: 2.3 Ratio (ref <5.0)
VLDL: 13 mg/dL (ref <30)

## 2016-03-07 LAB — IRON AND TIBC
%SAT: 42 % (ref 11–50)
Iron: 169 ug/dL (ref 40–190)
TIBC: 399 ug/dL (ref 250–450)
UIBC: 230 ug/dL (ref 125–400)

## 2016-03-07 LAB — MAGNESIUM: MAGNESIUM: 2.2 mg/dL (ref 1.5–2.5)

## 2016-03-07 LAB — HEMOGLOBIN A1C
Hgb A1c MFr Bld: 5.1 % (ref <5.7)
Mean Plasma Glucose: 100 mg/dL

## 2016-03-07 LAB — TSH: TSH: 1.48 m[IU]/L

## 2016-03-08 LAB — URINALYSIS, ROUTINE W REFLEX MICROSCOPIC
Bilirubin Urine: NEGATIVE
GLUCOSE, UA: NEGATIVE
Hgb urine dipstick: NEGATIVE
Ketones, ur: NEGATIVE
Leukocytes, UA: NEGATIVE
Nitrite: NEGATIVE
PROTEIN: NEGATIVE
SPECIFIC GRAVITY, URINE: 1.014 (ref 1.001–1.035)
pH: 6.5 (ref 5.0–8.0)

## 2016-03-08 LAB — MICROALBUMIN / CREATININE URINE RATIO
Creatinine, Urine: 100 mg/dL (ref 20–320)
MICROALB/CREAT RATIO: 6 ug/mg{creat} (ref <30)
Microalb, Ur: 0.6 mg/dL

## 2016-03-08 LAB — INSULIN, RANDOM: INSULIN: 17.5 u[IU]/mL (ref 2.0–19.6)

## 2016-03-08 LAB — VITAMIN B12: Vitamin B-12: 1092 pg/mL (ref 200–1100)

## 2016-03-08 LAB — VITAMIN D 25 HYDROXY (VIT D DEFICIENCY, FRACTURES): VIT D 25 HYDROXY: 39 ng/mL (ref 30–100)

## 2017-03-30 ENCOUNTER — Encounter: Payer: Self-pay | Admitting: Internal Medicine
